# Patient Record
Sex: Female | Born: 1965 | Race: White | Hispanic: No | Marital: Married | Smoking: Former smoker
Health system: Southern US, Community
[De-identification: ages and names within clinical notes are randomized; demographics above are authoritative.]

## PROBLEM LIST (undated history)

## (undated) DIAGNOSIS — E109 Type 1 diabetes mellitus without complications: Secondary | ICD-10-CM

## (undated) DIAGNOSIS — F419 Anxiety disorder, unspecified: Secondary | ICD-10-CM

## (undated) DIAGNOSIS — E7800 Pure hypercholesterolemia, unspecified: Secondary | ICD-10-CM

## (undated) DIAGNOSIS — E079 Disorder of thyroid, unspecified: Secondary | ICD-10-CM

## (undated) DIAGNOSIS — G43909 Migraine, unspecified, not intractable, without status migrainosus: Secondary | ICD-10-CM

## (undated) HISTORY — DX: Migraine, unspecified, not intractable, without status migrainosus: G43.909

## (undated) HISTORY — DX: Disorder of thyroid, unspecified: E07.9

## (undated) HISTORY — DX: Pure hypercholesterolemia, unspecified: E78.00

## (undated) HISTORY — DX: Anxiety disorder, unspecified: F41.9

## (undated) HISTORY — DX: Type 1 diabetes mellitus without complications: E10.9

---

## 1998-02-07 DIAGNOSIS — E109 Type 1 diabetes mellitus without complications: Secondary | ICD-10-CM

## 1998-02-07 HISTORY — DX: Type 1 diabetes mellitus without complications: E10.9

## 1998-10-09 DIAGNOSIS — E119 Type 2 diabetes mellitus without complications: Secondary | ICD-10-CM | POA: Insufficient documentation

## 1998-11-05 ENCOUNTER — Encounter: Admission: RE | Admit: 1998-11-05 | Discharge: 1999-02-03 | Payer: Self-pay | Admitting: *Deleted

## 2000-11-20 ENCOUNTER — Other Ambulatory Visit: Admission: RE | Admit: 2000-11-20 | Discharge: 2000-11-20 | Payer: Self-pay | Admitting: *Deleted

## 2002-05-14 ENCOUNTER — Encounter: Admission: RE | Admit: 2002-05-14 | Discharge: 2002-05-14 | Payer: Self-pay | Admitting: *Deleted

## 2002-06-05 ENCOUNTER — Encounter: Admission: RE | Admit: 2002-06-05 | Discharge: 2002-06-05 | Payer: Self-pay | Admitting: *Deleted

## 2002-06-12 ENCOUNTER — Encounter: Admission: RE | Admit: 2002-06-12 | Discharge: 2002-06-12 | Payer: Self-pay | Admitting: *Deleted

## 2002-06-12 ENCOUNTER — Ambulatory Visit (HOSPITAL_COMMUNITY): Admission: RE | Admit: 2002-06-12 | Discharge: 2002-06-12 | Payer: Self-pay | Admitting: *Deleted

## 2002-06-26 ENCOUNTER — Encounter: Admission: RE | Admit: 2002-06-26 | Discharge: 2002-06-26 | Payer: Self-pay | Admitting: *Deleted

## 2002-07-10 ENCOUNTER — Encounter: Admission: RE | Admit: 2002-07-10 | Discharge: 2002-07-10 | Payer: Self-pay | Admitting: *Deleted

## 2002-07-30 ENCOUNTER — Ambulatory Visit (HOSPITAL_COMMUNITY): Admission: RE | Admit: 2002-07-30 | Discharge: 2002-07-30 | Payer: Self-pay | Admitting: *Deleted

## 2002-08-07 ENCOUNTER — Encounter: Admission: RE | Admit: 2002-08-07 | Discharge: 2002-08-07 | Payer: Self-pay | Admitting: *Deleted

## 2002-08-21 ENCOUNTER — Encounter: Admission: RE | Admit: 2002-08-21 | Discharge: 2002-08-21 | Payer: Self-pay | Admitting: *Deleted

## 2002-09-04 ENCOUNTER — Encounter: Admission: RE | Admit: 2002-09-04 | Discharge: 2002-09-04 | Payer: Self-pay | Admitting: *Deleted

## 2002-09-25 ENCOUNTER — Encounter: Admission: RE | Admit: 2002-09-25 | Discharge: 2002-09-25 | Payer: Self-pay | Admitting: *Deleted

## 2002-09-26 ENCOUNTER — Inpatient Hospital Stay (HOSPITAL_COMMUNITY): Admission: AD | Admit: 2002-09-26 | Discharge: 2002-09-26 | Payer: Self-pay | Admitting: Obstetrics & Gynecology

## 2002-10-16 ENCOUNTER — Encounter: Admission: RE | Admit: 2002-10-16 | Discharge: 2002-10-16 | Payer: Self-pay | Admitting: *Deleted

## 2002-10-29 ENCOUNTER — Inpatient Hospital Stay (HOSPITAL_COMMUNITY): Admission: AD | Admit: 2002-10-29 | Discharge: 2002-10-30 | Payer: Self-pay | Admitting: *Deleted

## 2002-10-30 ENCOUNTER — Encounter: Admission: RE | Admit: 2002-10-30 | Discharge: 2002-10-30 | Payer: Self-pay | Admitting: *Deleted

## 2002-10-31 ENCOUNTER — Encounter: Payer: Self-pay | Admitting: Obstetrics and Gynecology

## 2002-10-31 ENCOUNTER — Inpatient Hospital Stay (HOSPITAL_COMMUNITY): Admission: RE | Admit: 2002-10-31 | Discharge: 2002-11-01 | Payer: Self-pay | Admitting: *Deleted

## 2002-11-04 ENCOUNTER — Ambulatory Visit (HOSPITAL_COMMUNITY): Admission: RE | Admit: 2002-11-04 | Discharge: 2002-11-04 | Payer: Self-pay | Admitting: *Deleted

## 2002-11-06 ENCOUNTER — Encounter: Admission: RE | Admit: 2002-11-06 | Discharge: 2002-11-06 | Payer: Self-pay | Admitting: *Deleted

## 2002-11-11 ENCOUNTER — Encounter: Admission: RE | Admit: 2002-11-11 | Discharge: 2002-11-11 | Payer: Self-pay | Admitting: *Deleted

## 2002-11-13 ENCOUNTER — Encounter: Admission: RE | Admit: 2002-11-13 | Discharge: 2002-11-13 | Payer: Self-pay | Admitting: *Deleted

## 2002-11-18 ENCOUNTER — Encounter: Admission: RE | Admit: 2002-11-18 | Discharge: 2002-11-18 | Payer: Self-pay | Admitting: *Deleted

## 2002-11-20 ENCOUNTER — Encounter: Admission: RE | Admit: 2002-11-20 | Discharge: 2002-11-20 | Payer: Self-pay | Admitting: *Deleted

## 2002-11-25 ENCOUNTER — Encounter: Admission: RE | Admit: 2002-11-25 | Discharge: 2002-11-25 | Payer: Self-pay | Admitting: *Deleted

## 2002-11-27 ENCOUNTER — Encounter: Admission: RE | Admit: 2002-11-27 | Discharge: 2002-11-27 | Payer: Self-pay | Admitting: *Deleted

## 2002-11-29 ENCOUNTER — Inpatient Hospital Stay (HOSPITAL_COMMUNITY): Admission: AD | Admit: 2002-11-29 | Discharge: 2002-12-02 | Payer: Self-pay | Admitting: Family Medicine

## 2004-06-17 ENCOUNTER — Emergency Department (HOSPITAL_COMMUNITY): Admission: EM | Admit: 2004-06-17 | Discharge: 2004-06-18 | Payer: Self-pay | Admitting: Emergency Medicine

## 2004-06-18 ENCOUNTER — Ambulatory Visit (HOSPITAL_COMMUNITY): Admission: RE | Admit: 2004-06-18 | Discharge: 2004-06-18 | Payer: Self-pay | Admitting: Internal Medicine

## 2004-11-16 ENCOUNTER — Emergency Department (HOSPITAL_COMMUNITY): Admission: EM | Admit: 2004-11-16 | Discharge: 2004-11-16 | Payer: Self-pay | Admitting: *Deleted

## 2004-12-09 ENCOUNTER — Other Ambulatory Visit: Admission: RE | Admit: 2004-12-09 | Discharge: 2004-12-09 | Payer: Self-pay | Admitting: Internal Medicine

## 2005-10-11 ENCOUNTER — Ambulatory Visit: Payer: Self-pay

## 2005-10-18 ENCOUNTER — Ambulatory Visit: Payer: Self-pay

## 2005-11-15 ENCOUNTER — Ambulatory Visit: Payer: Self-pay

## 2005-11-22 ENCOUNTER — Ambulatory Visit: Payer: Self-pay

## 2005-11-29 ENCOUNTER — Ambulatory Visit: Payer: Self-pay

## 2005-12-06 ENCOUNTER — Ambulatory Visit: Payer: Self-pay

## 2005-12-13 ENCOUNTER — Ambulatory Visit: Payer: Self-pay

## 2005-12-20 ENCOUNTER — Ambulatory Visit: Payer: Self-pay

## 2006-01-10 ENCOUNTER — Ambulatory Visit: Payer: Self-pay

## 2006-03-21 ENCOUNTER — Ambulatory Visit: Payer: Self-pay

## 2006-04-04 ENCOUNTER — Ambulatory Visit: Payer: Self-pay

## 2007-02-12 ENCOUNTER — Ambulatory Visit: Payer: Self-pay | Admitting: Family Medicine

## 2007-02-12 ENCOUNTER — Encounter: Payer: Self-pay | Admitting: Family Medicine

## 2007-02-12 ENCOUNTER — Other Ambulatory Visit: Admission: RE | Admit: 2007-02-12 | Discharge: 2007-02-12 | Payer: Self-pay | Admitting: Family Medicine

## 2007-03-06 ENCOUNTER — Ambulatory Visit: Payer: Self-pay | Admitting: Family Medicine

## 2010-04-05 ENCOUNTER — Encounter (INDEPENDENT_AMBULATORY_CARE_PROVIDER_SITE_OTHER): Payer: BC Managed Care – PPO | Admitting: Family Medicine

## 2010-04-05 ENCOUNTER — Other Ambulatory Visit (HOSPITAL_COMMUNITY)
Admission: RE | Admit: 2010-04-05 | Discharge: 2010-04-05 | Disposition: A | Discharge status: Home / Self Care | Payer: BC Managed Care – PPO | Source: Ambulatory Visit | Attending: Family Medicine | Admitting: Family Medicine

## 2010-04-05 ENCOUNTER — Other Ambulatory Visit: Payer: Self-pay | Admitting: Family Medicine

## 2010-04-05 DIAGNOSIS — Z0000 Encounter for general adult medical examination without abnormal findings: Secondary | ICD-10-CM

## 2010-04-05 DIAGNOSIS — Z124 Encounter for screening for malignant neoplasm of cervix: Secondary | ICD-10-CM | POA: Insufficient documentation

## 2010-04-05 DIAGNOSIS — E109 Type 1 diabetes mellitus without complications: Secondary | ICD-10-CM

## 2010-06-25 NOTE — Consult Note (Signed)
NAME:  Briana Lopez, PELTZ NO.:  [id number]   MEDICAL RECORD NO.:  [id number]          PATIENT TYPE:  EMS   LOCATION:  ED                           FACILITY:  [facility]   PHYSICIAN:  Candyce Churn, M.D.DATE OF BIRTH:  [birth year]   DATE OF CONSULTATION:  06/17/2004  DATE OF DISCHARGE:                                   CONSULTATION   CHIEF COMPLAINT:  Right upper quadrant pain for greater than 24 hours.   HISTORY OF PRESENT ILLNESS:  Briana Lopez is a very pleasant 45 year old  female with a history of insulin-dependent diabetes mellitus for five years.  That is her only medical problem.   She states that over the past two months she has had a couple of episodes of  right upper quadrant pain that has not necessarily been related to eating.  Apparently one of these was at night.  Over the past 24 to 36 hours, she has  had fairly persistent right upper quadrant soreness and feels that if she  could  just throw up, that she would feel better.  She denies any fever or  chills, diarrhea, vomiting, shortness of breath, PND or orthopnea.  She has  no pleuritic symptoms.   She presents to the [facility] Emergency Room for evaluation.   ALLERGIES:  Question of an allergy to PENICILLIN but very questionable.  Was  told that she might be allergic to penicillin as a child.   MEDICATIONS:  The patient is on an insulin pump with Humalog.  Her regimen  for insulin is as follows:  From 12 midnight to 3 a.m., she is on 0.3 units  per hour.  From 3 a.m. to 6 a.m., she is on 0.5 units per hour.  From 6 a.m.  to 5 p.m., she is on 0.6 units per hour and from 5 p.m. to 12 midnight, she  is on 0.55 units per hour.  She gives herself bolus therapy of one unit for  every 10 g of food.  Her endocrinologist is Dr. Elmer Picker at [facility].   Other medications include digestive enzymes originally prescribed by Dr.  Eldred Manges called Digestozyme.  Apparently these  are proteases, lipases  and amylases in capsule form.   PAST SURGICAL HISTORY:  Noncontributory.   OB/GYN HISTORY:  She is gravida 3, para 3.   FAMILY HISTORY:  Significant for breast cancer in her father's mother and  her mother's grandmother and her mother's great grandmother had colon  cancer.  Her father is a Counsellor and smokes and drinks too much according to  her.  Her mother died at age 44 secondary to a motorcycle accident.   SOCIAL HISTORY:  She does not smoke and she does drink one glass of wine at  night.  Her husband is a Investment banker, operational at [employer] and also [address] here in  [place].  Her children are Selena age 25, Zane age 8, and Lorre Munroe age 19-1/2.   REVIEW OF SYSTEMS:  As per HPI.   PHYSICAL EXAMINATION:  GENERAL:  She is alert,  oriented female, complaining  of right upper quadrant pain.  She just wants it to go away.  VITAL SIGNS:  Temperature 98, blood pressure 128/89, heart rate 74 and  regular, respiratory rate 20 and nonlabored.  HEENT:  Atraumatic, normocephalic.  Pupils equal and reactive.  Oropharynx  clear and moist.  NECK:  Supple without JVD, lymphadenopathy or thyromegaly.  CHEST:  Clear to auscultation.  CARDIAC:  Regular rhythm without murmur, rub, or gallop.  ABDOMEN:  Soft and tender in the right upper quadrant especially with  inspiration up under the costal margin.  Not severely so but certainly  uncomfortable for her.  There is some mild tenderness over the right lower  quadrant but not nearly so much as in the right upper quadrant.  Her bowel  sounds are normal and there is no rebound.  PELVIC AND RECTAL:  Not performed.  EXTREMITIES:  Without clubbing, cyanosis or edema.  Good capillary refill  distally.  NEUROLOGIC:  Oriented x3, completely intact peripherally.   LABORATORY DATA:  Chest x-ray reveals no active disease.  EKG reveals sinus  rhythm at 60 beats per minute.  Normal EKG.  Normal intervals and axis.  No  ST segment depression or  elevation.  White blood cell count 5200, hemoglobin  12.4, platelet count 330,000.  She has 56% neutrophils on differential, 34%  lymphocytes.  Total protein is 6.4, albumin 3.4, sodium 136, potassium 3.6,  chloride 102, bicarb 28, glucose 133, BUN 16, creatinine 0.7, calcium 9.  Total protein is 6.4, albumin 3.4, AST 18, ALT 15, alk phos 50, total  bilirubin 0.7, direct bilirubin 0.1, indirect bilirubin 0.6. Amylase is  normal at 38, lipase normal at 23.  Abdominal ultrasound is pending.   ASSESSMENT:  A 45 year old female with insulin-dependent diabetes mellitus  with normal vital signs, laboratories, and the only abnormality on physical  examination currently is tenderness in the right upper quadrant.  I suspect  that she could have biliary colic with a stone in the cystic duct.  She has  no evidence for any infection at this time.  Other causes of mild right  lower quadrant and increase in her right upper quadrant pain could be  rupture ovarian cyst.  There is low probability for appendicitis.   Should her abdominal ultrasound be normal, could certainly discharge her  home on pain medication and schedule a biliary scan tomorrow to further rule  out cholecystitis.       RNG/MEDQ  D:  06/17/2004  T:  06/18/2004  Job:  [id number]

## 2010-06-25 NOTE — Discharge Summary (Signed)
NAME:  Briana Lopez, Briana Lopez                          ACCOUNT NO.:  [id number]   MEDICAL RECORD NO.:  [id number]                   PATIENT TYPE:  INP   LOCATION:  9105                                 FACILITY:  [facility]   PHYSICIAN:  Tanya S. Shawnie Pons, M.D.                DATE OF BIRTH:  [birth year]   DATE OF ADMISSION:  10/30/2002  DATE OF DISCHARGE:                                 DISCHARGE SUMMARY   FINAL DIAGNOSES:  1. Intrauterine pregnancy at 33 weeks.  2. Insulin-dependent diabetes.  3. Persistent headache, unclear etiology.   PROCEDURES:  Include a CT of the brain as well as lumbar puncture.   CONSULTANTS:  Neurology.   PERTINENT LABORATORY DATA:  Includes a 24-hour urine that revealed 124 mg of  protein.  Her uric acid was 1.8, her LDH was less than 105, a CMP that was  within normal limits, a CBC that was also normal.  She also had a CSF  culture that was negative as well as a gram stain which was also negative.  She had normal intracranial pressure.  She had daily NSTs that looked to be  normal.   REASON FOR ADMISSION:  The patient was a 45 year old gravida 6 para 2-0-3-2  at 33 weeks who came into the high risk clinic complaining of persistent  headache.  Following evaluation the patient was brought in for full  evaluation and neurology consult.  On the neurology consult MRI and MR  venogram were ordered.  However, the patient is very claustrophobic and  could not tolerate these so instead a CT without contrast was used to rule  out subdural hematoma, subarachnoid hemorrhage, evidence of intracranial  pressure tumor, and all these things were ruled out by this exam.  She also  underwent an LP.  There was no evidence of increased intracranial pressure  and gram stain and cytology were negative for CSF.  Given the negative  workup it was felt the patient was stable for discharge.  However, she did  continue to have headache at that time.   DISCHARGE DISPOSITION:  1. The patient  discharged home in slightly improved condition.  2. Instructions will include activity as tolerated, return with worsening     headache or focal neurologic symptoms.  3. Discharge medications include insulin pump to be used as she has been     using in the hospital and Percocet 5/325 one to two p.o. q.4-6h. p.r.n.     pain.  4. Follow up in high risk clinic on Wednesday if pain medicine fails to be     adequate or is not meeting her needs.  Will reassess her pain needs at     that time.  Shelbie Proctor. Shawnie Pons, M.D.    TSP/MEDQ  D:  11/01/2002  T:  11/01/2002  Job:  [id number]

## 2010-06-25 NOTE — Consult Note (Signed)
NAME:  Briana Lopez, BERGSMA NO.:  [id number]   MEDICAL RECORD NO.:  [id number]                   PATIENT TYPE:  OUT   LOCATION:  ULT                                  FACILITY:  [facility]   PHYSICIAN:  Melvyn Novas, M.D.               DATE OF BIRTH:  04/10/1965   DATE OF CONSULTATION:  DATE OF DISCHARGE:                                   CONSULTATION   REASON FOR ADMISSION:  Briana Lopez is admitted today  for new onset headache.  This is her neurological chief complaint.   HISTORY AND PHYSICAL:  The patient is a 45 year old right-handed Caucasian  female, gravida 3, para 2, insulin dependent diabetic since July 2001, who  presents at 44 weeks of pregnancy with severe  onset of headache last night  at about 9 p.m. The headaches, according to the patient woke her from sleep,  and she also noticed that she had some visual changes associated with it.  She described white dots and lights in the peripheral vision, a more  migrainous phenomenon. The patient denied, however, vertigo, nausea or  vomiting and had no focal weakness or neurologic symptoms otherwise. She was  able to resume sleeping earlier today  after presenting to the emergency  room at 2 a.m.   She denies a history of migraine but has  a positive family history in her  maternal family. She also has no recent febrile illness, but  stated that  she had to cough a couple of times earlier today, nonproductive, but felt  the headache increase with each coughing.   REVIEW OF SYSTEMS:  Negative for nausea or vomiting, fever, abdominal  cramping, neck  stiffness or changes in the level of consciousness. She  describes a pounding, focal, left parietal headache that is getting worse  with Valsalva maneuver. She has no history of recent  infections or  migraine.   FAMILY HISTORY:  Positive for migraine.   SOCIAL HISTORY:  The patient is married. She has 2 children, 5 and 2 years  old. No alcohol, drug  or  nicotine use.   PAST MEDICAL HISTORY:  1. Positive for gestational diabetes with the 2 earlier pregnancies.  2. Since July 2000, the patient developed diabetes outside her previous     pregnancies and now has become insulin dependent.  3. She has no history hypertension, renal problems or neuropathy.   MEDICATIONS:  Insulin and prenatal vitamins.   ALLERGIES:  None.   PHYSICAL EXAMINATION:  GENERAL: The patient is healthy appearing, well  groomed, slender, fully oriented, alert and pleasant. She has no clubbing,  cyanosis or edema. No mucous membrane abnormalities, no icterus or rash.  There is also no lymphadenopathy.  VITAL SIGNS:  Blood pressure 196/86, heart rate 72, respiratory rate 22 to  24, temperature 98 degrees  Farenheit.  NEUROLOGIC:  Pupils are equal, round and reactive to light and  accomodation.  She has full and conuit extraocular movements without nystagmus. No  papilledema. No visual fields deficits. No focal asymmetry or sensory loss  over  the face. Uvula was midline  with prompt gag. The neck  is supple. She  has full range of motion. Motor examination  shows 5/5 strength, equal tone  and mass. Sensory is intact to vibration and touch. Coordination, the  patient shows  a normal finger-to-nose test without signs of ataxia, tremor  or dysmetria. Gait and stance are intact. The patient walked to the bathroom  and back to the bed  without any coordination deficits.   ASSESSMENT:  Sudden throbbing headache without signs of infection and  without any history of migraine, in a diabetic 45 year old woman, [redacted] weeks  pregnant. Her BUN is 10 and in discussion with Dr. Gavin Potters I learned that  this is considered a mild dehydration syndrome in a pregnant woman. She has  leg spasms but no abdominal cramping and has no history of vascular  headaches. She could have developed venous rheological abnormalities.   PLAN:  Treat her first with oral magnesium q.4h. and give at the  same time  Tylenol #3 for the headaches p.o. I also encouraged oral fluid intake. If  the patient cannot drink at least 2 liters of fluid a day, I would  supplement with IV. If this fails to treat the headache over the next 3 to 4  hour periods, I suggest to obtain an MR venogram. Should the headaches  become worse in this time period, I would do the MR venogram even urgently.  I thank Grandis for the consultation and hope that Briana Lopez can go home  tomorrow morning in good health.                                                Melvyn Novas, M.D.    CD/MEDQ  D:  10/30/2002  T:  11/03/2002  Job:  [id number]

## 2010-06-25 NOTE — Consult Note (Signed)
   NAME:  Briana Lopez, Briana Lopez                          ACCOUNT NO.:  [id number]   MEDICAL RECORD NO.:  [id number]                   PATIENT TYPE:  INP   LOCATION:  9105                                 FACILITY:  [facility]   PHYSICIAN:  Melvyn Novas, M.D.               DATE OF BIRTH:  1965/08/18   DATE OF CONSULTATION:  DATE OF DISCHARGE:  11/01/2002                                   CONSULTATION   PROCEDURE:  Lumbar puncture.   INDICATIONS FOR PROCEDURE:  Lumbar puncture. The patient's blood laboratory  results reveal on coagulopathy or cytopenia. The patient was admitted with  vascular appearing headaches and had a normal CT scan of the brain as well  as lack of papilledema and no focal neurologic deficits. The LP is intended  to send CSF to rule out a subarachnoid hemorrhage or viral infection.   DESCRIPTION OF PROCEDURE:  The patient was placed sitting  on the edge of  the  bed, bending forward towards her nurse who assisted the patient. Easy  landmarks were visible and demarcated between L2 and L3. A 1% Lidocaine  solution was used to numb the area. The patient was prepped with Betadine  and draped with sterile cloth.   Then 4 vials of 2 mL of CSF were obtained showing untinged clear fluid  without cloudiness, blood or icteric color. The needle was retrieved and no  bleed focally occurred. The patient received a Band-Aid over the area of the  lumbar puncture and was asked to rest for 90 minutes and oral fluids were  encouraged. We will send for cells, Gram stain, viral panel and  cytology as  well as serial red blood cell count.                                               Melvyn Novas, M.D.    CD/MEDQ  D:  10/31/2002  T:  11/03/2002  Job:  [id number]

## 2011-08-01 ENCOUNTER — Encounter: Payer: Self-pay | Admitting: Internal Medicine

## 2011-08-04 ENCOUNTER — Encounter: Payer: Self-pay | Admitting: Family Medicine

## 2011-08-04 ENCOUNTER — Ambulatory Visit (INDEPENDENT_AMBULATORY_CARE_PROVIDER_SITE_OTHER): Payer: BC Managed Care – PPO | Admitting: Family Medicine

## 2011-08-04 VITALS — BP 120/88 | HR 64 | Ht 64.5 in | Wt 127.0 lb

## 2011-08-04 DIAGNOSIS — E7800 Pure hypercholesterolemia, unspecified: Secondary | ICD-10-CM

## 2011-08-04 DIAGNOSIS — E109 Type 1 diabetes mellitus without complications: Secondary | ICD-10-CM

## 2011-08-04 DIAGNOSIS — Z0000 Encounter for general adult medical examination without abnormal findings: Secondary | ICD-10-CM

## 2011-08-04 DIAGNOSIS — R5381 Other malaise: Secondary | ICD-10-CM

## 2011-08-04 DIAGNOSIS — R5383 Other fatigue: Secondary | ICD-10-CM

## 2011-08-04 LAB — POCT URINALYSIS DIPSTICK
Ketones, UA: NEGATIVE
Protein, UA: NEGATIVE
Spec Grav, UA: 1.01

## 2011-08-04 NOTE — Patient Instructions (Addendum)
HEALTH MAINTENANCE RECOMMENDATIONS:  It is recommended that you get at least 30 minutes of aerobic exercise at least 5 days/week (for weight loss, you may need as much as 60-90 minutes). This can be any activity that gets your heart rate up. This can be divided in 10-15 minute intervals if needed, but try and build up your endurance at least once a week.  Weight bearing exercise is also recommended twice weekly.  Eat a healthy diet with lots of vegetables, fruits and fiber.  "Colorful" foods have a lot of vitamins (ie green vegetables, tomatoes, red peppers, etc).  Limit sweet tea, regular sodas and alcoholic beverages, all of which has a lot of calories and sugar.  Up to 1 alcoholic drink daily may be beneficial for women (unless trying to lose weight, watch sugars).  Drink a lot of water.  Calcium recommendations are 1200-1500 mg daily (1500 mg for postmenopausal women or women without ovaries), and vitamin D 1000 IU daily.  This should be obtained from diet and/or supplements (vitamins), and calcium should not be taken all at once, but in divided doses.  Monthly self breast exams and yearly mammograms for women over the age of 69 is recommended.  Sunscreen of at least SPF 30 should be used on all sun-exposed parts of the skin when outside between the hours of 10 am and 4 pm (not just when at beach or pool, but even with exercise, golf, tennis, and yard work!)  Use a sunscreen that says "broad spectrum" so it covers both UVA and UVB rays, and make sure to reapply every 1-2 hours.  Remember to change the batteries in your smoke detectors when changing your clock times in the spring and fall.  Use your seat belt every time you are in a car, and please drive safely and not be distracted with cell phones and texting while driving.  I will review labs from Dr. Almond Lint.  You should discuss your cholesterol with him--whether or not a medication (even if OTC like red yeast rice) is recommended.  Please DO  NOT SMOKE. Get flu shots yearly Please schedule your mammogram

## 2011-08-04 NOTE — Progress Notes (Signed)
Chief Complaint  Patient presents with  . Annual Exam    annual exam with pap. Just had labs done in May with her endocrinologist in [place]. Did make mention that she is having trouble having orgasms lately, unsure if this is due to diabetes or just her age.   No problems achieving orgasm with self-stimulation, just with boyfriend.  Libido is okay. She reports that her BP high this morning due to a very stressful morning.   She had labs done through [facility] recently, and will forward results.  Hyperlipidemia:  Last LDL 139.  She states she showed to Dr. Almond Lint (her endo) and was told it was "fine".  She knows that she eats too much red meat, and loves her cheese and wine, but has made some adjustments to her diet since last year.  Labs recently drawn through [facility] Maintenance: Immunization History  Administered Date(s) Administered  . Tdap 04/05/2010  occasionally gets flu shots, not last year. Got pneumovax a few years ago (through Dr. Almond Lint) Last Pap smear: 03/2010 Last mammogram: 3/09 Last colonoscopy: never Last DEXA: never Dentist: twice yearly Ophtho: within the year (Jan or Feb) Exercise: daily (Crossfit, running and hot yoga)  Past Medical History  Diagnosis Date  . Diabetes mellitus type 1 2000  . Pure hypercholesterolemia   . Migraines     with pregnancy    History reviewed. No pertinent past surgical history.  History   Social History  . Marital Status: Married    Spouse Name: N/A    Number of Children: 3  . Years of Education: N/A   Occupational History  . hair stylist    Social History Main Topics  . Smoking status: Current Some Day Smoker    Types: Cigarettes  . Smokeless tobacco: Never Used  . Alcohol Use: Yes     6-8 drinks per week (on Thurs thru Sat)  . Drug Use: No  . Sexually Active: Yes -- Female partner(s)     boyfriend had vasectomy   Other Topics Concern  . Not on file   Social History Narrative   Divorced.  Lives with 3 children, 2  dogs and a cat    Family History  Problem Relation Age of Onset  . Cancer Father     lung cancer  . Hypertension Father   . Alcohol abuse Brother   . Cancer Maternal Grandmother     colon cancer in 29's  . Cancer Paternal Grandmother     breast cancer in her 6's  . Heart disease Paternal Grandmother   . Diabetes Neg Hx     Current outpatient prescriptions:insulin detemir (LEVEMIR) 100 UNIT/ML injection, Inject 7 Units into the skin 2 (two) times daily. , Disp: , Rfl: ;  insulin lispro (HUMALOG) 100 UNIT/ML injection, Inject into the skin 3 (three) times daily before meals. 1 U SQ for every 10 carbs she eats, Disp: , Rfl: ;  Multiple Vitamins-Minerals (MULTIVITAMIN WITH MINERALS) tablet, Take 1 tablet by mouth daily., Disp: , Rfl:   Allergies  Allergen Reactions  . Penicillins    ROS: The patient denies anorexia, fever, weight changes, headaches,  vision changes, decreased hearing, ear pain, sore throat, breast concerns, chest pain, palpitations, dizziness, syncope, dyspnea on exertion, cough, swelling, nausea, vomiting, diarrhea, constipation, abdominal pain, melena, hematochezia, indigestion/heartburn, hematuria, incontinence, dysuria, irregular menstrual cycles, vaginal discharge, odor or itch, genital lesions, joint pains, numbness, tingling, weakness, tremor, suspicious skin lesions, depression, anxiety, abnormal bleeding/bruising, or enlarged lymph nodes.  PHYSICAL EXAM: BP 120/88  Pulse 64  Ht 5' 4.5" (1.638 m)  Wt 127 lb (57.607 kg)  BMI 21.46 kg/m2  LMP 07/12/2011  General Appearance:    Alert, cooperative, no distress, appears stated age  Head:    Normocephalic, without obvious abnormality, atraumatic  Eyes:    PERRL, conjunctiva/corneas clear, EOM's intact, fundi    benign  Ears:    Normal TM's and external ear canals  Nose:   Nares normal, mucosa normal, no drainage or sinus   tenderness  Throat:   Lips, mucosa, and tongue normal; teeth and gums normal  Neck:    Supple, no lymphadenopathy;  thyroid:  no   enlargement/tenderness/nodules; no carotid   bruit or JVD  Back:    Spine nontender, no curvature, ROM normal, no CVA     tenderness  Lungs:     Clear to auscultation bilaterally without wheezes, rales or     ronchi; respirations unlabored  Chest Wall:    No tenderness or deformity   Heart:    Regular rate and rhythm, S1 and S2 normal, no murmur, rub   or gallop  Breast Exam:    No tenderness, masses, or nipple discharge or inversion.      No axillary lymphadenopathy  Abdomen:     Soft, non-tender, nondistended, normoactive bowel sounds,    no masses, no hepatosplenomegaly  Genitalia:    Normal external genitalia without lesions.  BUS and vagina normal; cervix without lesions, or cervical motion tenderness. No abnormal vaginal discharge.  Uterus and adnexa not enlarged, nontender, no masses.  Pap not performed  Rectal:    Normal tone, no masses or tenderness; guaiac negative stool  Extremities:   No clubbing, cyanosis or edema  Pulses:   2+ and symmetric all extremities  Skin:   Skin color, texture, turgor normal, no rashes or lesions  Lymph nodes:   Cervical, supraclavicular, and axillary nodes normal  Neurologic:   CNII-XII intact, normal strength, sensation and gait; reflexes 2+ and symmetric throughout          Psych:   Normal mood, affect, hygiene and grooming.    ASSESSMENT/PLAN: 1. Routine general medical examination at a health care facility  POCT Urinalysis Dipstick, Visual acuity screening, HIV Antibody, GC/Chlamydia Amp Probe, Genital  2. Other malaise and fatigue  Vitamin D 25 hydroxy  3. Type I (juvenile type) diabetes mellitus without mention of complication, not stated as uncontrolled    4. Pure hypercholesterolemia     Hyperlipidemia.  Reviewed low cholesterol diet.  Discussed goals of LDL, as well as red yeast rice/OTC ways to treat (vs rx statins, and discussed need for monitoring LFT's while using).  Recommend discussing with  endo (and who would treat/monitor).  Await labs.  Flu shots recommended yearly.  UTD on pneumovax per pt. Schedule mammogram, past due, encouraged to get them yearly.  Review labs from [facility] pt if any additional tests needed (?if thyroid done--likely c-met, lipids, A1c, and other tests were done) Discussed monthly self breast exams and yearly mammograms after the age of 65; at least 30 minutes of aerobic activity at least 5 days/week; proper sunscreen use reviewed; healthy diet, including goals of calcium and vitamin D intake and alcohol recommendations (less than or equal to 1 drink/day) reviewed; regular seatbelt use; changing batteries in smoke detectors.  Immunization recommendations discussed--yearly flu shots recommended.  Colonoscopy recommendations reviewed--age 4  Trouble with orgasms--discussed that doesn't appear to have physiologic cause (ie diabetes) given  that she is able to achieve orgasm with self-stimulation.  Discussed that it could be related to issues with her partner/stressors, and that she can help show him how to help her.

## 2011-08-05 LAB — VITAMIN D 25 HYDROXY (VIT D DEFICIENCY, FRACTURES): Vit D, 25-Hydroxy: 43 ng/mL (ref 30–89)

## 2011-08-05 LAB — GC/CHLAMYDIA PROBE AMP, GENITAL: GC Probe Amp, Genital: NEGATIVE

## 2012-06-05 DIAGNOSIS — R7989 Other specified abnormal findings of blood chemistry: Secondary | ICD-10-CM | POA: Insufficient documentation

## 2012-07-26 ENCOUNTER — Ambulatory Visit: Payer: BC Managed Care – PPO | Admitting: Family Medicine

## 2012-08-08 ENCOUNTER — Encounter: Payer: Self-pay | Admitting: Family Medicine

## 2012-08-08 ENCOUNTER — Ambulatory Visit: Payer: BC Managed Care – PPO | Admitting: Family Medicine

## 2012-08-08 ENCOUNTER — Ambulatory Visit (INDEPENDENT_AMBULATORY_CARE_PROVIDER_SITE_OTHER): Payer: BC Managed Care – PPO | Admitting: Family Medicine

## 2012-08-08 VITALS — BP 112/80 | HR 72 | Ht 65.0 in | Wt 130.0 lb

## 2012-08-08 DIAGNOSIS — R102 Pelvic and perineal pain: Secondary | ICD-10-CM

## 2012-08-08 DIAGNOSIS — N949 Unspecified condition associated with female genital organs and menstrual cycle: Secondary | ICD-10-CM

## 2012-08-08 NOTE — Patient Instructions (Addendum)
We will be scheduling you for a pelvic ultrasound.  You likely have a small ovarian cyst.  Continue to use ibuprofen as needed for the pain. We will contact you with results and plan.

## 2012-08-08 NOTE — Progress Notes (Signed)
Chief Complaint  Patient presents with  . Advice Only    pain in ovary area, more on the right side x 6 months. Notices that when her period is about to start it worsens, also if she has to have a bowel movement. Does state that she had PID in her early twenties and it feels the same.   Pain had been intermittent, but became more constant in the last 3-4 weeks.  Pain is RLQ, sometimes she also has discomfort on the left (the week before menses).  Slight vaginal discharge, white, unchanged from her norm.  There is no odor, itching. She is in a monogamous relationship x 4 years, unprotected.  She started doing Crossfit in February.  Denies pain with exercise, no noted bulging.  Menses are regular, once a month, but changing--last month was heavy, and lasted 8-9 days, this most recent period was shorter and lighter.  No h/o ovarian cysts.  H/o PID in her 21's  Past Medical History  Diagnosis Date  . Diabetes mellitus type 1 2000  . Pure hypercholesterolemia   . Migraines     with pregnancy   No past surgical history on file.  History   Social History  . Marital Status: Married    Spouse Name: N/A    Number of Children: 3  . Years of Education: N/A   Occupational History  . hair stylist    Social History Main Topics  . Smoking status: Current Some Day Smoker    Types: Cigarettes  . Smokeless tobacco: Never Used  . Alcohol Use: Yes     Comment: 6-8 drinks per week (on Thurs thru Sat)  . Drug Use: No  . Sexually Active: Yes -- Female partner(s)     Comment: boyfriend had vasectomy   Other Topics Concern  . Not on file   Social History Narrative   Divorced.  Lives with 3 children, 2 dogs and a cat   Current outpatient prescriptions:ALPRAZolam (XANAX) 0.5 MG tablet, Take 0.25 mg by mouth at bedtime as needed (for flying)., Disp: , Rfl: ;  insulin detemir (LEVEMIR) 100 UNIT/ML injection, Inject 9 Units into the skin 2 (two) times daily. , Disp: , Rfl: ;  Insulin Glulisine (APIDRA  SOLOSTAR IJ), Inject 1 Units as directed 5 (five) times daily as needed (uses 1 Unit per 15 carbs.)., Disp: , Rfl:  Nutritional Supplements (NUTRITIONAL SUPPLEMENT PO), Take 2 capsules by mouth 2 (two) times daily. Thyroid supplement ok'd by endocrinologist., Disp: , Rfl: ;  Multiple Vitamins-Minerals (MULTIVITAMIN WITH MINERALS) tablet, Take 1 tablet by mouth daily., Disp: , Rfl:   Allergies  Allergen Reactions  . Penicillins    ROS:  Denies fevers, chills, cough, shortness of breath, chest pain, nausea, vomiting, bowel changes, dysuria, bleeding/bruising, or other concerns.  See HPI  PHYSICAL EXAM: BP 112/80  Pulse 72  Ht 5\' 5"  (1.651 m)  Wt 130 lb (58.968 kg)  BMI 21.63 kg/m2  LMP 08/02/2012 Well developed, pleasant female in no distress Heart: regular rate and rhythm Lungs: clear Abdomen: soft, normal bowel sounds.  Area of discomfort is right lower abdomen, in suprapubic area.  nontender at McBurney's point.   No mass, rebound tenderness or guarding. Pelvic exam--tender at R adnexa, not enlarged.  No cervical motion tenderness.  L adnexa normal, nontender  ASSESSMENT/PLAN:  Pelvic pain in female - Plan: US Pelvis Complete, US Transvaginal Non-OB  Pelvic u/s to eval for suspected small right ovarian cyst.  Continue ibuprofen as needed  Refer to GYN only if concern on u/s, of increasing pain

## 2012-08-20 ENCOUNTER — Ambulatory Visit
Admission: RE | Admit: 2012-08-20 | Discharge: 2012-08-20 | Disposition: A | Discharge status: Home / Self Care | Payer: BC Managed Care – PPO | Source: Ambulatory Visit | Attending: Family Medicine | Admitting: Family Medicine

## 2012-08-20 ENCOUNTER — Other Ambulatory Visit: Payer: Self-pay | Admitting: Family Medicine

## 2012-08-20 ENCOUNTER — Other Ambulatory Visit: Payer: BC Managed Care – PPO

## 2012-08-20 DIAGNOSIS — R102 Pelvic and perineal pain: Secondary | ICD-10-CM

## 2012-08-20 DIAGNOSIS — Z1231 Encounter for screening mammogram for malignant neoplasm of breast: Secondary | ICD-10-CM

## 2012-10-01 ENCOUNTER — Other Ambulatory Visit: Payer: Self-pay | Admitting: Family Medicine

## 2012-10-01 ENCOUNTER — Ambulatory Visit (HOSPITAL_COMMUNITY)
Admission: RE | Admit: 2012-10-01 | Discharge: 2012-10-01 | Disposition: A | Discharge status: Home / Self Care | Payer: BC Managed Care – PPO | Source: Ambulatory Visit | Attending: Family Medicine | Admitting: Family Medicine

## 2012-10-01 DIAGNOSIS — Z1231 Encounter for screening mammogram for malignant neoplasm of breast: Secondary | ICD-10-CM

## 2012-10-05 ENCOUNTER — Telehealth: Payer: Self-pay | Admitting: Internal Medicine

## 2012-10-05 NOTE — Telephone Encounter (Signed)
Faxed over medical records to [facility] system @ [phone]

## 2013-05-29 ENCOUNTER — Encounter: Payer: Self-pay | Admitting: Family Medicine

## 2014-02-21 ENCOUNTER — Encounter: Payer: Self-pay | Admitting: Internal Medicine

## 2014-05-09 ENCOUNTER — Encounter: Payer: Self-pay | Admitting: Internal Medicine

## 2014-05-16 ENCOUNTER — Ambulatory Visit (INDEPENDENT_AMBULATORY_CARE_PROVIDER_SITE_OTHER): Payer: BLUE CROSS/BLUE SHIELD | Admitting: Internal Medicine

## 2014-05-16 DIAGNOSIS — Z7189 Other specified counseling: Secondary | ICD-10-CM

## 2014-05-16 DIAGNOSIS — Z23 Encounter for immunization: Secondary | ICD-10-CM

## 2014-05-16 DIAGNOSIS — Z7184 Encounter for health counseling related to travel: Secondary | ICD-10-CM | POA: Insufficient documentation

## 2014-05-16 MED ORDER — TETANUS-DIPHTH-ACELL PERTUSSIS 5-2.5-18.5 LF-MCG/0.5 IM SUSP
0.5000 mL | Freq: Once | INTRAMUSCULAR | Status: AC
  Administered 2014-05-16: 0.5 mL via INTRAMUSCULAR

## 2014-05-16 MED ORDER — CIPROFLOXACIN HCL 500 MG PO TABS: 500.0000 mg | ORAL_TABLET | Freq: Two times a day (BID) | ORAL | Status: DC

## 2014-05-16 MED ORDER — TYPHOID VI POLYSACCHARIDE VACC 25 MCG/0.5ML IM SOLN
0.5000 mL | Freq: Once | INTRAMUSCULAR | Status: AC
  Administered 2014-05-16: 0.5 mL via INTRAMUSCULAR

## 2014-05-16 MED ORDER — ATOVAQUONE-PROGUANIL HCL 250-100 MG PO TABS: 1.0000 | ORAL_TABLET | Freq: Every day | ORAL | Status: DC

## 2014-05-16 MED ORDER — AZITHROMYCIN 500 MG PO TABS: 1000.0000 mg | ORAL_TABLET | Freq: Once | ORAL | Status: DC

## 2014-05-16 NOTE — Patient Instructions (Signed)
[facility]                [address], [place]                   [place], [place] [address]                      Phone: [phone]                        Fax: [phone]   Planned departure date: July 10          Planned return date: 2 weeks Countries of travel: [place], [place] and [place]   Guidelines for the Prevention & Treatment of Traveler's Diarrhea  Prevention: "Boil it, Peel it, Zena it, or Forget it"   the fewer chances -> lower risk: try to stick to food & water precautions as much as possible"   If it's "piping hot"; it is probably okay, if not, it may not be   Treatment   1) You should always take care to drink lots of fluids in order to avoid dehydration   2) You should bring medications with you in case you come down with a case of diarrhea   3) OTC = bring pepto-bismol - can take with initial abdominal symptoms;                    Imodium - can help slow down your intestinal tract, can help relief cramps                    and diarrhea, can take if no bloody diarrhea  Use ciprofloxacin or azithromycin for the children if needed for traveler's diarrhea  Guidelines for the Prevention of Malaria  Avoidance:  -fewer mosquito bites = lower risk. Mosquitos can bite at night as well as daytime  -cover up (long sleeve clothing), mosquito nets, screens  -Insect repellent for your skin ( DEET containing lotion > 20%): for clothes ( permethrin spray)   On July 8 start malarone, daily dose starting 1-2 days before entering endemic area, ending 7 days after leaving area for malaria prevention.   Immunizations received today: Hepatitis A series, Td and Typhoid (parenteral)  Future immunizations, if indicated Hepatitis A series in 6 months   Prior to travel:  1) Be sure to pick up appropriate prescriptions, including medicine you take daily. Do not expect to be able to fill your prescriptions abroad.  2)  Strongly consider obtaining traveler's insurance, including emergency evacuation insurance. Most plans in the [place] do not cover participants abroad. (see below for resources)  3) Register at the appropriate [place] embassy or consulate with travel dates so they are aware of your presence in-country and for helpful advice during travel using the [employer] (STEP, GreenNylon.com.cy).  4) Leave contact information with a relative or friend.  5) Keep a Research officer, political party, credit cards in case they become lost or stolen  6) Inform your credit card company that you will be travelling abroad   During travel:  1) If you become ill and need medical advice, the [place] [employer]	[place] of the country you are traveling in general provides a list of [place] speaking doctors.  We are also available on MyChart for remote consultation if you register prior to travel. 2) Avoid motorcycles or scooters when at all  possible. Traffic laws in many countries are lax and accidents occur frequently.  3) Do not take any unnecessary risks that you wouldn't do at home.   Resources:  -Country specific information: BlindResource.ca or GreenNylon.com.cy  -Press photographer (DEET, mosquito nets): [employer], [employer] store, [employer], [place] insurance options: gatewayplans.com; http://clayton-rivera.info/; travelguard.com or Good [employer], gninsurance.com or info@gninsurance .com, [id number].   Post Travel:  If you return from your trip ill, call your primary care doctor or our travel clinic @ [phone].   Enjoy your trip and know that with proper pre-travel preparation, most people have an enjoyable and uninterrupted trip!

## 2014-05-16 NOTE — Progress Notes (Signed)
Subjective:   Briana Lopez is a 48 y.o. female who presents to the Infectious Disease clinic for travel consultation. Planned departure date: July 10          Planned return date: 2 weeks Countries of travel: [place], [place] and [place] Areas in country: safari   Accommodations: safari Purpose of travel: vacation Prior travel out of [place]: yes     Objective:   Medications: reviewed    Assessment:    No contraindications to travel. none     Plan:    Issues discussed: environmental concerns, future shots, insect-borne illnesses, malaria, MVA safety, rabies, safe food/water, traveler's diarrhea, website/handouts for more information, what to do if ill upon return and what to do if ill while there. Immunizations recommended: Hepatitis A series, Td and Typhoid (parenteral). Malaria prophylaxis: malarone, daily dose starting 1-2 days before entering endemic area, ending 7 days after leaving area Traveler's diarrhea prophylaxis: ciprofloxacin. Total duration of visit: 1 Hour. Total time spent on education, counseling, coordination of care: 30 Minutes.

## 2014-08-13 ENCOUNTER — Ambulatory Visit (INDEPENDENT_AMBULATORY_CARE_PROVIDER_SITE_OTHER): Payer: BLUE CROSS/BLUE SHIELD | Admitting: Family Medicine

## 2014-08-13 ENCOUNTER — Encounter: Payer: Self-pay | Admitting: Family Medicine

## 2014-08-13 VITALS — BP 110/60 | HR 66 | Ht 65.0 in | Wt 127.0 lb

## 2014-08-13 DIAGNOSIS — Z7189 Other specified counseling: Secondary | ICD-10-CM

## 2014-08-13 DIAGNOSIS — Z7184 Encounter for health counseling related to travel: Secondary | ICD-10-CM

## 2014-08-13 DIAGNOSIS — F419 Anxiety disorder, unspecified: Secondary | ICD-10-CM

## 2014-08-13 DIAGNOSIS — B373 Candidiasis of vulva and vagina: Secondary | ICD-10-CM | POA: Diagnosis not present

## 2014-08-13 DIAGNOSIS — B3731 Acute candidiasis of vulva and vagina: Secondary | ICD-10-CM

## 2014-08-13 MED ORDER — ALPRAZOLAM 0.5 MG PO TABS: 0.2500 mg | ORAL_TABLET | Freq: Three times a day (TID) | ORAL | Status: DC | PRN

## 2014-08-13 MED ORDER — FLUCONAZOLE 150 MG PO TABS: 150.0000 mg | ORAL_TABLET | Freq: Once | ORAL | Status: DC

## 2014-08-13 NOTE — Patient Instructions (Signed)
Take the diflucan today.  Repeat in 1 week if symptoms haven't completely resolved. If you need to use something topical, I would try a medicated powder (ie gold bond), or possibly vagisil for vaginal irritation (not the yeast kind, since we are treating the yeast with the pill).  I encourage you to take the malaria preventative medication as directed.

## 2014-08-13 NOTE — Progress Notes (Signed)
Chief Complaint  Patient presents with  . Vaginitis    started yesterday with itching and burns  . Medication Management    needs refill on xanax for flying   She started 2 days ago with vaginal itching and burning.  No significant discharge.  Monogamous relationship x 6 years. No recent antibiotics. Denies any sores. Denies abdominal/pelvic pain.  She was just in [place], hiking on the [place] for 10 days.  Sugars have been running a little high (intentionally, while hiking, afraid of hypoglycemia). It was very hot, some days went without panties.  One day she had panties that rubbed/irritated.  Used new/different soaps when in [place].  She is going to [place] Friday. She is unsure if she is going to take the anti-malarial medications that were prescribed. She is requesting refill of alprazolam to use for flying.  PMH, PSH, SH reviewed. Outpatient Encounter Prescriptions as of 08/13/2014  Medication Sig  . ALPRAZolam (XANAX) 0.5 MG tablet Take 0.5-1 tablets (0.25-0.5 mg total) by mouth 3 (three) times daily as needed for anxiety (for flying).  . insulin detemir (LEVEMIR) 100 UNIT/ML injection Inject 9 Units into the skin 2 (two) times daily.   . Insulin Glulisine (APIDRA SOLOSTAR IJ) Inject 1 Units as directed 5 (five) times daily as needed (uses 1 Unit per 15 carbs.).  Marland Kitchen Multiple Vitamins-Minerals (MULTIVITAMIN WITH MINERALS) tablet Take 1 tablet by mouth daily.  . Nutritional Supplements (NUTRITIONAL SUPPLEMENT PO) Take 2 capsules by mouth 2 (two) times daily. Thyroid supplement ok'd by endocrinologist.  . [DISCONTINUED] ALPRAZolam (XANAX) 0.5 MG tablet Take 0.25 mg by mouth at bedtime as needed (for flying).  Marland Kitchen atovaquone-proguanil (MALARONE) 250-100 MG TABS Take 1 tablet by mouth daily. Start 2 days prior to travel to malaria area, throughout travel and for 7 days upon return. (Patient not taking: Reported on 08/13/2014)  . fluconazole (DIFLUCAN) 150 MG tablet Take 1 tablet (150 mg  total) by mouth once. Take 1 tablet by mouth for yeast infection.  Repeat in 1 week if needed  . [DISCONTINUED] azithromycin (ZITHROMAX) 500 MG tablet Take 2 tablets (1,000 mg total) by mouth once. Take 2 tabs once for Traveler's diarrhea  . [DISCONTINUED] ciprofloxacin (CIPRO) 500 MG tablet Take 1 tablet (500 mg total) by mouth 2 (two) times daily. Take 1-2 days until diarrhea resolves   No facility-administered encounter medications on file as of 08/13/2014.   (diflucan was rx'd at visit, not taking prior).  Allergies  Allergen Reactions  . Penicillins     ROS: no fever, chills, URI symptoms, headache, GI complaints, urinary complaints, bleeding, bruising, or other complaints.  No depression, or anxiety except related to flying. See HPI.  PHYSICAL EXAM: BP 110/60 mmHg  Pulse 66  Ht 5\' 5"  (1.651 m)  Wt 127 lb (57.607 kg)  BMI 21.13 kg/m2  SpO2 99%  Well developed, well appearing female in no distress External genitalia:  There is some erythema in the entire area--small papules in pubic area where she just recently waxed. Erythema of labia minora and introitus, with very small amount of thick white discharge.  With speculum exam, normal vaginal mucosa, normal cervix, only small amount of very thin discharge, clear-white.  ASSESSMENT/PLAN:  Yeast vaginitis - likely related to heat/moisture and higher sugars. treat with diflucan. repeat in 1 week if needed - Plan: fluconazole (DIFLUCAN) 150 MG tablet  Anxiety - Plan: ALPRAZolam (XANAX) 0.5 MG tablet  Travel advice encounter - strongly encouraged use of malaria prevention (she already has  rx)   Treat presumptively for yeast infection.

## 2014-09-25 ENCOUNTER — Other Ambulatory Visit (HOSPITAL_COMMUNITY)
Admission: RE | Admit: 2014-09-25 | Discharge: 2014-09-25 | Disposition: A | Discharge status: Home / Self Care | Payer: BLUE CROSS/BLUE SHIELD | Source: Ambulatory Visit | Attending: Family Medicine | Admitting: Family Medicine

## 2014-09-25 ENCOUNTER — Encounter: Payer: Self-pay | Admitting: Family Medicine

## 2014-09-25 ENCOUNTER — Ambulatory Visit (INDEPENDENT_AMBULATORY_CARE_PROVIDER_SITE_OTHER): Payer: BLUE CROSS/BLUE SHIELD | Admitting: Family Medicine

## 2014-09-25 VITALS — BP 110/80 | HR 80 | Ht 64.75 in | Wt 129.6 lb

## 2014-09-25 DIAGNOSIS — Z5181 Encounter for therapeutic drug level monitoring: Secondary | ICD-10-CM | POA: Diagnosis not present

## 2014-09-25 DIAGNOSIS — E039 Hypothyroidism, unspecified: Secondary | ICD-10-CM | POA: Diagnosis not present

## 2014-09-25 DIAGNOSIS — Z1151 Encounter for screening for human papillomavirus (HPV): Secondary | ICD-10-CM | POA: Insufficient documentation

## 2014-09-25 DIAGNOSIS — E10649 Type 1 diabetes mellitus with hypoglycemia without coma: Secondary | ICD-10-CM

## 2014-09-25 DIAGNOSIS — Z0000 Encounter for general adult medical examination without abnormal findings: Secondary | ICD-10-CM

## 2014-09-25 DIAGNOSIS — R41840 Attention and concentration deficit: Secondary | ICD-10-CM | POA: Diagnosis not present

## 2014-09-25 DIAGNOSIS — E7800 Pure hypercholesterolemia, unspecified: Secondary | ICD-10-CM

## 2014-09-25 DIAGNOSIS — R7989 Other specified abnormal findings of blood chemistry: Secondary | ICD-10-CM

## 2014-09-25 DIAGNOSIS — Z01411 Encounter for gynecological examination (general) (routine) with abnormal findings: Secondary | ICD-10-CM | POA: Diagnosis not present

## 2014-09-25 DIAGNOSIS — R946 Abnormal results of thyroid function studies: Secondary | ICD-10-CM

## 2014-09-25 DIAGNOSIS — Z23 Encounter for immunization: Secondary | ICD-10-CM | POA: Diagnosis not present

## 2014-09-25 LAB — POCT URINALYSIS DIPSTICK
Bilirubin, UA: NEGATIVE
GLUCOSE UA: NEGATIVE
Ketones, UA: NEGATIVE
Leukocytes, UA: NEGATIVE
Nitrite, UA: NEGATIVE
PH UA: 6
Protein, UA: NEGATIVE
SPEC GRAV UA: 1.02
UROBILINOGEN UA: NEGATIVE

## 2014-09-25 NOTE — Patient Instructions (Signed)
  HEALTH MAINTENANCE RECOMMENDATIONS:  It is recommended that you get at least 30 minutes of aerobic exercise at least 5 days/week (for weight loss, you may need as much as 60-90 minutes). This can be any activity that gets your heart rate up. This can be divided in 10-15 minute intervals if needed, but try and build up your endurance at least once a week.  Weight bearing exercise is also recommended twice weekly.  Eat a healthy diet with lots of vegetables, fruits and fiber.  "Colorful" foods have a lot of vitamins (ie green vegetables, tomatoes, red peppers, etc).  Limit sweet tea, regular sodas and alcoholic beverages, all of which has a lot of calories and sugar.  Up to 1 alcoholic drink daily may be beneficial for women (unless trying to lose weight, watch sugars).  Drink a lot of water.  Calcium recommendations are 1200-1500 mg daily (1500 mg for postmenopausal women or women without ovaries), and vitamin D 1000 IU daily.  This should be obtained from diet and/or supplements (vitamins), and calcium should not be taken all at once, but in divided doses.  Monthly self breast exams and yearly mammograms for women over the age of 87 is recommended.  Sunscreen of at least SPF 30 should be used on all sun-exposed parts of the skin when outside between the hours of 10 am and 4 pm (not just when at beach or pool, but even with exercise, golf, tennis, and yard work!)  Use a sunscreen that says "broad spectrum" so it covers both UVA and UVB rays, and make sure to reapply every 1-2 hours.  Remember to change the batteries in your smoke detectors when changing your clock times in the spring and fall.  Use your seat belt every time you are in a car, and please drive safely and not be distracted with cell phones and texting while driving.  Please call to schedule your yearly mammogram. Call to schedule routine follow up with Dr. Roberto Scales.  Consider seeing Dr. [facility]e Sim[facility] at [facility]) for ADD  evaluation/testing. Try and work on time management--doing little at a time rather than saving it all up and getting overwhelmed.  Last of the Hepatitis A series is due in October. You can schedule a nurse visit here

## 2014-09-25 NOTE — Progress Notes (Signed)
Chief Complaint  Patient presents with  . Annual Exam    nonfasting annual exam with pap (started menstrual cycle this morning). Would like to know if you can check her hormones today. Also requesting rx for adderall.    Briana Lopez is a 49 y.o. female who presents for a complete physical.  She has the following concerns:  Asking about hormones simply due to her age.  Denies symptoms--menses are regular each month, no hot flashes.  Some night sweats but is related to low blood sugars.  This is less often since she is on the insulin pump.  Her three children have ADD, only one of whom is on Ritalin. She has taken her son's ritalin in the past, when she has to get a lot of paperwork done. It "makes her mean as a snake" later that afternoon, but allowed her to get through the pile of paperwork that accumulates.  She is asking for Adderall to take when she needs to get her paperwork done.  She is asking for that rather than Ritalin, hoping she wouldn't have the same side effect (she has a friend taking it for similar reasons).  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  She reports sometimes she can be snappy and jumpy, never mean like the two times she took the ritalin.  She was seen last month for yeast vaginitis, treated with diflucan. Symptoms resolved She went to [place]. She had gone to ID clinic for travel vaccines in April.  She had a great trip, with no illnesses.  Sees Dr. Roberto Scales at [facility] for diabetes care. She is now on an insulin pump.  Last A1c was 6.5 per patient.  She was started on thyroid medication in 2015 (she initially stated it was 64mcg, but let us know the following day when she returned for labs that it was actually 60mcg).  She has refused statins in the past (and was told that her levels were good).   Looked at [facility], and it turns out that her last labs were 06/2013. Admits she  hasn't been seen there in the last year, since she was doing so much better since on the pump.  She does not have a scheduled appointment.  She reports her "bowels are a wreck"--either constipated or "crazy diarrhea". She had watery diarrhea when she was in [place]. She started probiotics when she returned from her trip, and stool consistency has improved.  If she doesn't take them, she is constipated. Denies bloody stools, abdominal pain or mucus in the stool  Immunization History  Administered Date(s) Administered  . Hepatitis A, Adult 05/16/2014  . Tdap 04/05/2010, 05/16/2014  . Typhoid Inactivated 05/16/2014  she reports she has had pneumonia vaccine through Dr. Roberto Scales in the past (5 years?) Last Pap smear: 03/2010 Last mammogram: 09/2012 Last colonoscopy: never Last DEXA: never Dentist: 4 times/yr Ophtho: 02/2014 (had been late, not getting yearly prior to that, but UTD now). Exercise: daily--Crossfit, runs, walks.  Weight-bearing 3-4x/week. Uses exercise as a tool when she needs to get her sugar down also  Normal vitamin D level in 07/2011  Past Medical History  Diagnosis Date  . Diabetes mellitus type 1 2000    Dr. Roberto Scales [facility])  . Pure hypercholesterolemia   . Migraines     with pregnancy    History reviewed. No pertinent past surgical history.  Social History   Social History  .  Marital Status: Divorced    Spouse Name: N/A  . Number of Children: 3  . Years of Education: N/A   Occupational History  . hair stylist    Social History Main Topics  . Smoking status: Never Smoker   . Smokeless tobacco: Never Used  . Alcohol Use: 0.0 oz/week    0 Standard drinks or equivalent per week     Comment: 1 glass of wine each evening with dinner, 2nd glass on weekends (2-3 days/week)  . Drug Use: No  . Sexual Activity:    Partners: Male     Comment: boyfriend had vasectomy   Other Topics Concern  . Not on file   Social History Narrative   Divorced.  Lives  with 3 children (only 1 son currently lives at home, others are off at [place]), 2 dogs and a Neurosurgeon    Family History  Problem Relation Age of Onset  . Cancer Father     lung cancer  . Hypertension Father   . Alcohol abuse Brother   . Cancer Maternal Grandmother     colon cancer in 9's  . Cancer Paternal Grandmother     breast cancer in her 54's  . Heart disease Paternal Grandmother   . Diabetes Neg Hx     Outpatient Encounter Prescriptions as of 09/25/2014  Medication Sig Note  . insulin aspart (NOVOLOG) 100 UNIT/ML injection Inject into the skin 3 (three) times daily before meals. 09/25/2014: Sliding scale before meals; also on insulin pump now  . levothyroxine (SYNTHROID, LEVOTHROID) 25 MCG tablet Take 25 mcg by mouth daily before breakfast.   . Multiple Vitamins-Minerals (ALIVE WOMENS 50+ PO) Take 1 tablet by mouth daily.   . Multiple Vitamins-Minerals (MULTIVITAMIN WITH MINERALS) tablet Take 1 tablet by mouth daily.   . Probiotic Product (PROBIOTIC DAILY PO) Take 1 tablet by mouth daily.   Marland Kitchen ALPRAZolam (XANAX) 0.5 MG tablet Take 0.5-1 tablets (0.25-0.5 mg total) by mouth 3 (three) times daily as needed for anxiety (for flying). (Patient not taking: Reported on 09/25/2014)   . [DISCONTINUED] atovaquone-proguanil (MALARONE) 250-100 MG TABS Take 1 tablet by mouth daily. Start 2 days prior to travel to malaria area, throughout travel and for 7 days upon return. (Patient not taking: Reported on 08/13/2014)   . [DISCONTINUED] fluconazole (DIFLUCAN) 150 MG tablet Take 1 tablet (150 mg total) by mouth once. Take 1 tablet by mouth for yeast infection.  Repeat in 1 week if needed   . [DISCONTINUED] insulin detemir (LEVEMIR) 100 UNIT/ML injection Inject 9 Units into the skin 2 (two) times daily.    . [DISCONTINUED] Insulin Glulisine (APIDRA SOLOSTAR IJ) Inject 1 Units as directed 5 (five) times daily as needed (uses 1 Unit per 15 carbs.).   . [DISCONTINUED] Nutritional  Supplements (NUTRITIONAL SUPPLEMENT PO) Take 2 capsules by mouth 2 (two) times daily. Thyroid supplement ok'd by endocrinologist.    No facility-administered encounter medications on file as of 09/25/2014.  (thyroid dose is actually 55mcg, per updated info given 8/19)  Allergies  Allergen Reactions  . Penicillins     ROS: The patient denies anorexia, fever, weight changes, headaches, vision changes, decreased hearing, ear pain, sore throat, breast concerns, chest pain, palpitations, dizziness, syncope, dyspnea on exertion, cough, swelling, nausea, vomiting, abdominal pain, melena, hematochezia, indigestion/heartburn, hematuria, incontinence, dysuria, irregular menstrual cycles, vaginal discharge, odor or itch, genital lesions, joint pains (has ongoing discomfort/soreness to knees and hips related to her exercise, but no acute/severe problems);  no numbness, tingling, weakness, tremor, suspicious skin lesions, depression, anxiety, abnormal bleeding/bruising, or enlarged lymph nodes. Some worsening of distance vision noted (only wears her glasses driving). +constipation and diarrhea per HPI. No black/bloody/mucus in stools Slight anxiety, managed by exercise and 1/2 xanax if severe (less than once a month, occasionally if severe PMS/irritability; usually only uses xanax for flying)  PHYSICAL EXAM:  BP 110/80 mmHg  Pulse 80  Ht 5' 4.75" (1.645 m)  Wt 129 lb 9.6 oz (58.786 kg)  BMI 21.72 kg/m2  LMP 09/25/2014  General Appearance:   Alert, cooperative, no distress, appears stated age  Head:   Normocephalic, without obvious abnormality, atraumatic  Eyes:   PERRL, conjunctiva/corneas clear, EOM's intact, fundi   benign.  Wearing thick fake eyelashes  Ears:   Normal TM's and external ear canals  Nose:  Nares normal, mucosa normal, no drainage or sinus tenderness  Throat:  Lips, mucosa, and tongue normal; teeth and gums normal  Neck:  Supple, no lymphadenopathy; thyroid:  no enlargement/tenderness/nodules; no carotid  bruit or JVD  Back:  Spine nontender, no curvature, ROM normal, no CVA tenderness  Lungs:   Clear to auscultation bilaterally without wheezes, rales or ronchi; respirations unlabored  Chest Wall:   No tenderness or deformity  Heart:   Regular rate and rhythm, S1 and S2 normal, no murmur, rub  or gallop  Breast Exam:   No tenderness, masses, or nipple discharge or inversion. No axillary lymphadenopathy  Abdomen:   Soft, non-tender, nondistended, normoactive bowel sounds,   no masses, no hepatosplenomegaly  Genitalia:   Normal external genitalia without lesions. BUS and vagina normal; no cervical motion tenderness. Cervix appears normal, but partly obscured by blood. No abnormal vaginal discharge, blood in vaginal vault. Uterus and adnexa not enlarged, nontender, no masses. Pap performed  Rectal:   declined/refused  Extremities:  No clubbing, cyanosis or edema  Pulses:  2+ and symmetric all extremities  Skin:  Skin color, texture, turgor normal, no rashes or lesions  Lymph nodes:  Cervical, supraclavicular, and axillary nodes normal  Neurologic:  CNII-XII intact, normal strength, sensation and gait; reflexes 2+ and symmetric throughout   Psych: Normal mood, affect, hygiene and grooming.        ASSESSMENT/PLAN:  Annual physical exam - Plan: POCT Urinalysis Dipstick, Cytology - PAP [place]  Pure hypercholesterolemia - not on statin, due for check (will refuse statin) - Plan: Lipid panel  Elevated TSH - Plan: TSH  Type 1 diabetes mellitus with hypoglycemia and without coma - past due for f/u with endocrinologist. Return for fasting labs, and we will forward to Dr. Roberto Scales.  Schedule f/u appt with him - Plan: Lipid panel, Comprehensive metabolic panel, Microalbumin / creatinine urine ratio  Medication monitoring encounter - Plan: CBC with  Differential/Platelet, TSH, Comprehensive metabolic panel  Hypothyroidism, unspecified hypothyroidism type - check TSH; will forward copies to Dr. Roberto Scales - Plan: TSH  Poor concentration - procrastination; wanting stimulant. likely had ADD as a child, but do not think that meds are appropriate now. Anxiety/stress/overwhelmed contributing.   Sounds like probably ADD as child, not treated, but has been able to cope quite well until just recently.  Likely has component of anxiety/stress/overwhelmed-- Not enough time.  Spoke of using phone alarm, doing a little each day rather than saving it up. Adderall may have same effects as Ritalin, and I don't feel that stimulants are appropriate for this use. Briefly mentioned wellbutrin as an alternative treatment for ADD/anxiety, but would require daily  med.  She doesn't really want to take more meds daily. Testing might be a good idea--given name for [facility] vs testing through psychologists  Discussed monthly self breast exams and yearly mammograms (past due and reminded to schedule); at least 30 minutes of aerobic activity at least 5 days/week, weight bearing exercise at least 2x/wk; proper sunscreen use reviewed; healthy diet, including goals of calcium and vitamin D intake and alcohol recommendations (less than or equal to 1 drink/day) reviewed; regular seatbelt use; changing batteries in smoke detectors. Immunization recommendations discussed--yearly flu shots recommended--finally convinced to get one today.  If she had pneumonia vaccine through endocrinologist, then imms UTD. Advised that she can get second Hep A vaccine here in October (rather than ID clinic). Colonoscopy recommendations reviewed--age 65   Return for fasting labs, given that she is past due for f/u on her DM and thyroid with Dr. Roberto Scales.  Will send copies to him, and reminded her to schedule appointment. Re: vision loss--is with distance vision, and exam is UTD.  Ddx reviewed (related to  blood sugars, astigmatism)--likely just needs to wear her glasses more regularly. F/u with ophtho yearly, sooner if worsening vision.   F/u physical 1 year; fasting labs tomorrow F/u with endocrinologist

## 2014-09-26 ENCOUNTER — Telehealth: Payer: Self-pay | Admitting: Family Medicine

## 2014-09-26 ENCOUNTER — Other Ambulatory Visit: Payer: BLUE CROSS/BLUE SHIELD

## 2014-09-26 DIAGNOSIS — R7989 Other specified abnormal findings of blood chemistry: Secondary | ICD-10-CM

## 2014-09-26 DIAGNOSIS — E7800 Pure hypercholesterolemia, unspecified: Secondary | ICD-10-CM

## 2014-09-26 DIAGNOSIS — E039 Hypothyroidism, unspecified: Secondary | ICD-10-CM

## 2014-09-26 DIAGNOSIS — E10649 Type 1 diabetes mellitus with hypoglycemia without coma: Secondary | ICD-10-CM

## 2014-09-26 DIAGNOSIS — Z5181 Encounter for therapeutic drug level monitoring: Secondary | ICD-10-CM

## 2014-09-26 LAB — CBC WITH DIFFERENTIAL/PLATELET
Basophils Absolute: 0 10*3/uL (ref 0.0–0.1)
Basophils Relative: 0 % (ref 0–1)
EOS ABS: 0 10*3/uL (ref 0.0–0.7)
EOS PCT: 1 % (ref 0–5)
HEMATOCRIT: 39.7 % (ref 36.0–46.0)
Hemoglobin: 12.7 g/dL (ref 12.0–15.0)
LYMPHS ABS: 0.7 10*3/uL (ref 0.7–4.0)
LYMPHS PCT: 17 % (ref 12–46)
MCH: 28.5 pg (ref 26.0–34.0)
MCHC: 32 g/dL (ref 30.0–36.0)
MCV: 89.2 fL (ref 78.0–100.0)
MONO ABS: 0.3 10*3/uL (ref 0.1–1.0)
MONOS PCT: 8 % (ref 3–12)
MPV: 9 fL (ref 8.6–12.4)
Neutro Abs: 3 10*3/uL (ref 1.7–7.7)
Neutrophils Relative %: 74 % (ref 43–77)
PLATELETS: 299 10*3/uL (ref 150–400)
RBC: 4.45 MIL/uL (ref 3.87–5.11)
RDW: 14.9 % (ref 11.5–15.5)
WBC: 4.1 10*3/uL (ref 4.0–10.5)

## 2014-09-26 LAB — LIPID PANEL
Cholesterol: 217 mg/dL — ABNORMAL HIGH (ref 125–200)
HDL: 94 mg/dL (ref >=46)
LDL CALC: 110 mg/dL (ref <130)
Total CHOL/HDL Ratio: 2.3 Ratio (ref <=5.0)
Triglycerides: 64 mg/dL (ref <150)
VLDL: 13 mg/dL (ref <30)

## 2014-09-26 LAB — COMPREHENSIVE METABOLIC PANEL
ALT: 15 U/L (ref 6–29)
AST: 17 U/L (ref 10–35)
Albumin: 3.9 g/dL (ref 3.6–5.1)
Alkaline Phosphatase: 41 U/L (ref 33–115)
BUN: 12 mg/dL (ref 7–25)
CHLORIDE: 104 mmol/L (ref 98–110)
CO2: 28 mmol/L (ref 20–31)
CREATININE: 0.76 mg/dL (ref 0.50–1.10)
Calcium: 8.6 mg/dL (ref 8.6–10.2)
GLUCOSE: 85 mg/dL (ref 65–99)
POTASSIUM: 4.1 mmol/L (ref 3.5–5.3)
SODIUM: 140 mmol/L (ref 135–146)
TOTAL PROTEIN: 6.3 g/dL (ref 6.1–8.1)
Total Bilirubin: 0.8 mg/dL (ref 0.2–1.2)

## 2014-09-26 NOTE — Telephone Encounter (Signed)
Pt came in for labs today and while she was here she told me that the thyroid medication she is taking is 50 mg not 25 mg. She ask me to please let her know.

## 2014-09-26 NOTE — Telephone Encounter (Signed)
Noted and med list updated.

## 2014-09-27 LAB — TSH: TSH: 1.818 u[IU]/mL (ref 0.350–4.500)

## 2014-09-27 LAB — MICROALBUMIN / CREATININE URINE RATIO
Creatinine, Urine: 78.2 mg/dL
MICROALB UR: 0.2 mg/dL (ref <2.0)
Microalb Creat Ratio: 2.6 mg/g (ref 0.0–30.0)

## 2014-09-29 NOTE — Addendum Note (Signed)
Addended by: Carolee Rota F on: 09/29/2014 11:32 AM   Modules accepted: Orders

## 2014-09-30 LAB — CYTOLOGY - PAP

## 2014-10-01 ENCOUNTER — Encounter: Payer: Self-pay | Admitting: Family Medicine

## 2014-10-03 ENCOUNTER — Other Ambulatory Visit (INDEPENDENT_AMBULATORY_CARE_PROVIDER_SITE_OTHER): Payer: BLUE CROSS/BLUE SHIELD

## 2014-10-03 DIAGNOSIS — E109 Type 1 diabetes mellitus without complications: Secondary | ICD-10-CM | POA: Diagnosis not present

## 2014-10-03 LAB — POCT GLYCOSYLATED HEMOGLOBIN (HGB A1C): Hemoglobin A1C: 7.5

## 2014-11-11 ENCOUNTER — Telehealth: Payer: Self-pay

## 2014-11-11 NOTE — Telephone Encounter (Signed)
Medical records received from [facility] on 11/11/14

## 2014-11-17 ENCOUNTER — Encounter: Payer: Self-pay | Admitting: Family Medicine

## 2015-07-03 IMAGING — US US TRANSVAGINAL NON-OB
1 series · 14 of 25 positions shown · non-contrast
Comparison: None

CLINICAL DATA: Right adnexal pain.



[Series 1: us transvaginal non-ob · 0.32mm/px · 14 of 62 slices shown]
[im 1/62]
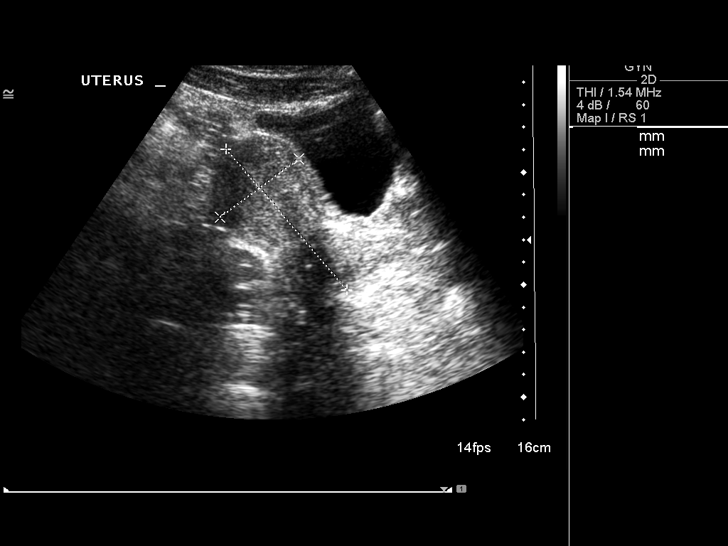
[im 6/62]
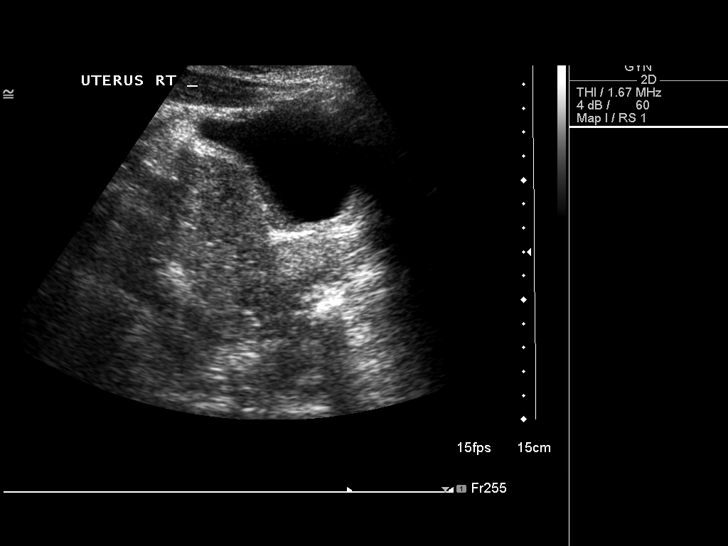
[im 11/62]
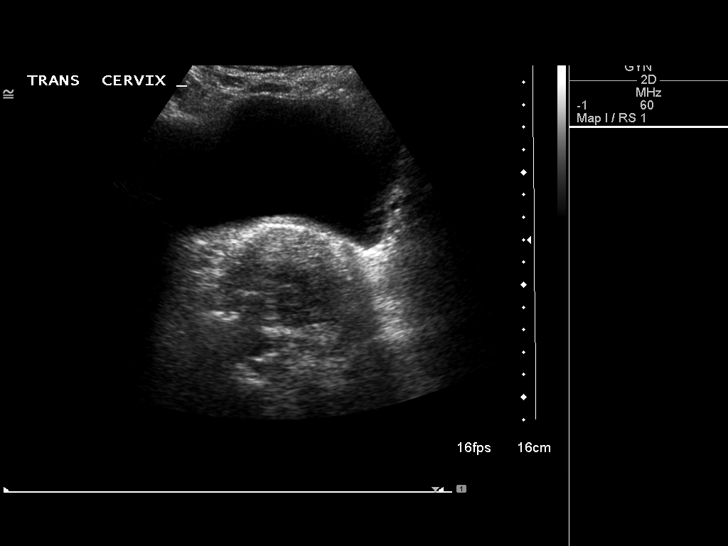
[im 16/62]
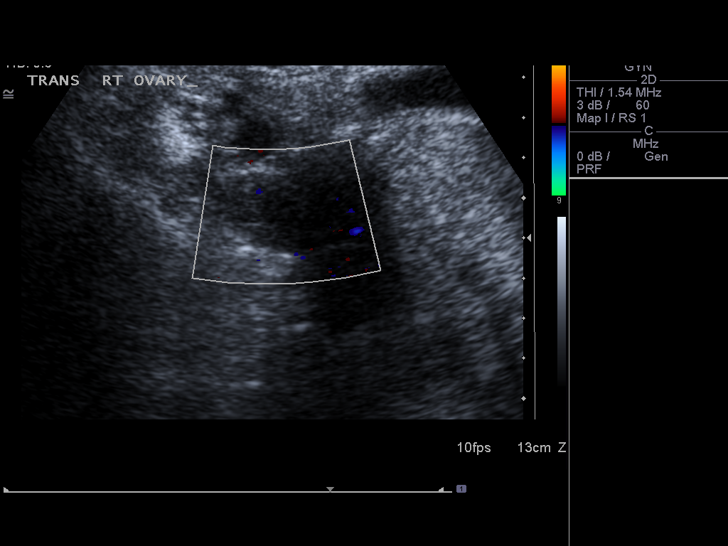
[im 21/62]
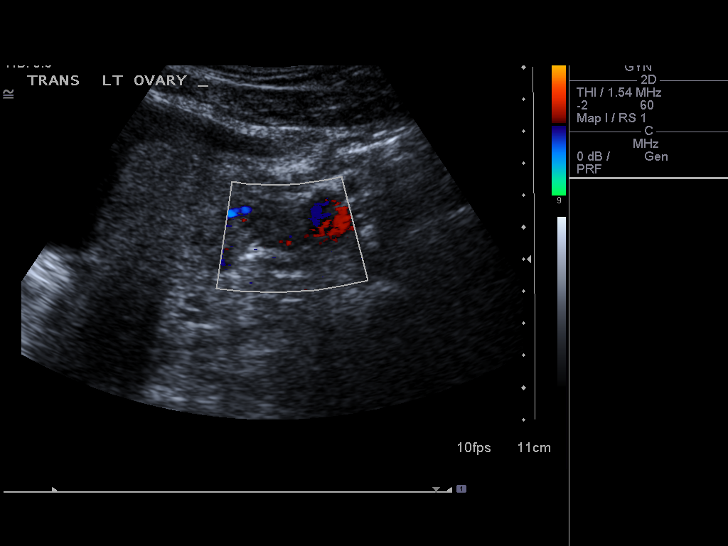
[im 23/62]
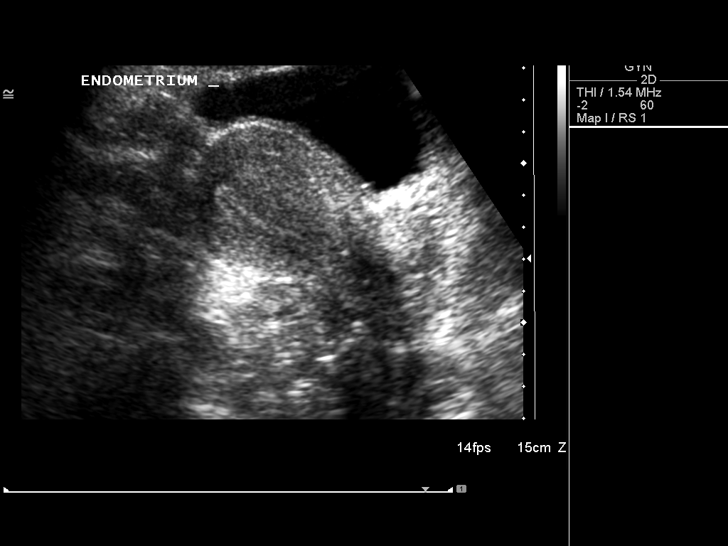
[im 28/62]
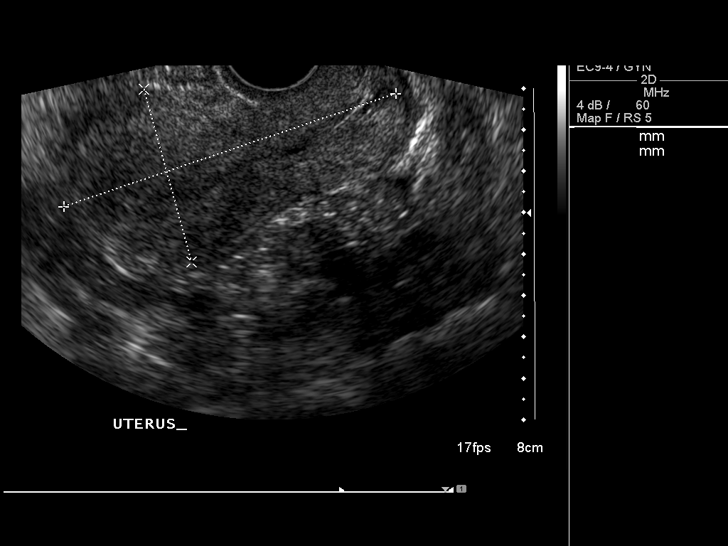
[im 34/62]
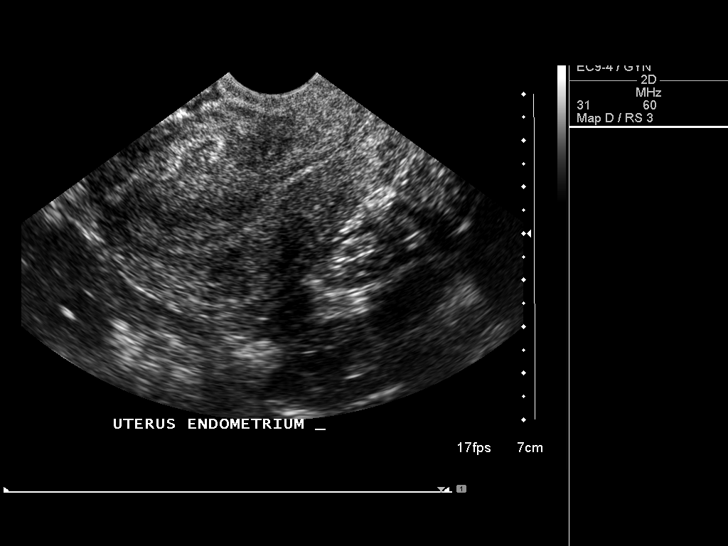
[im 39/62]
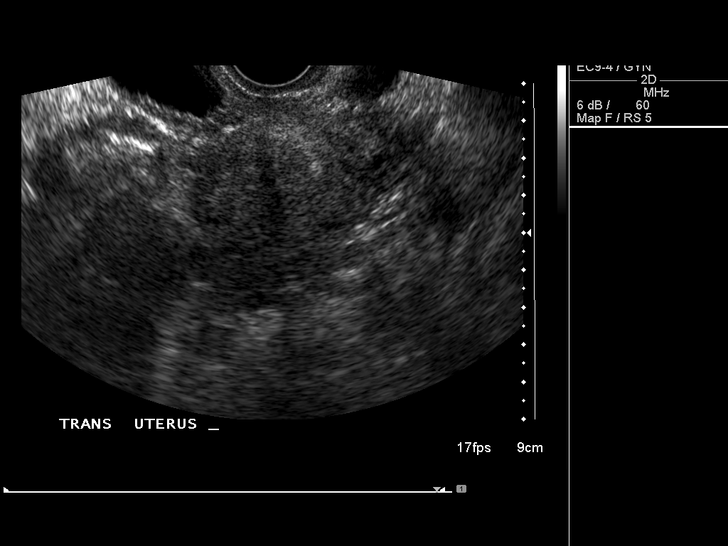
[im 41/62]
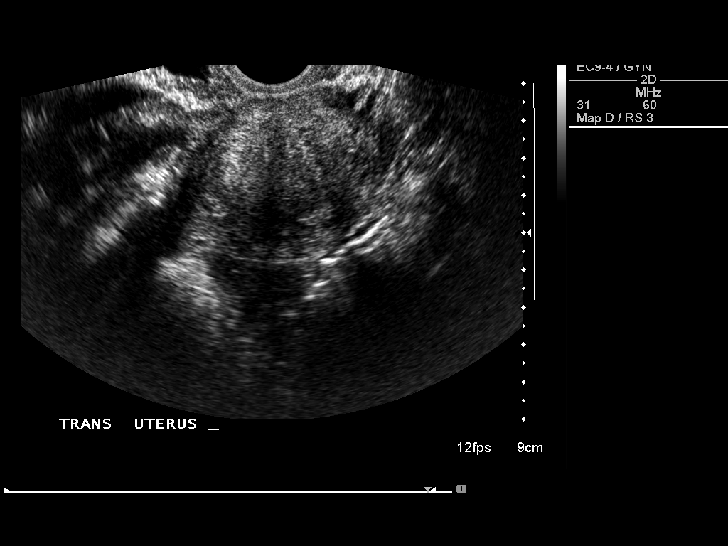
[im 46/62]
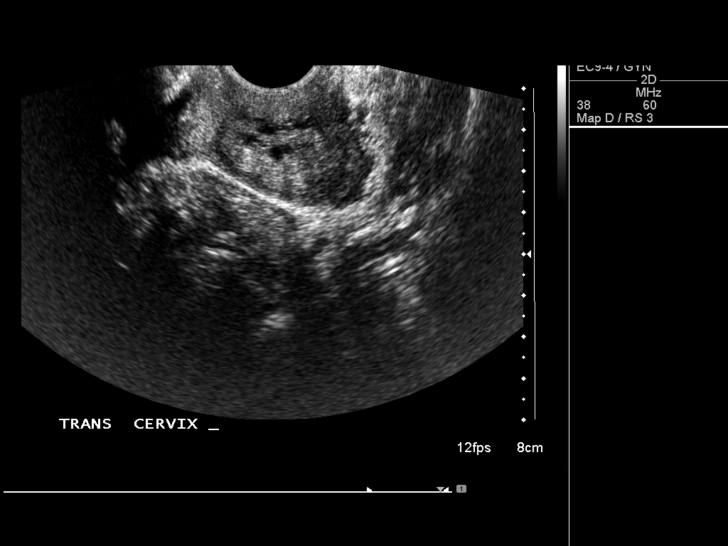
[im 51/62]
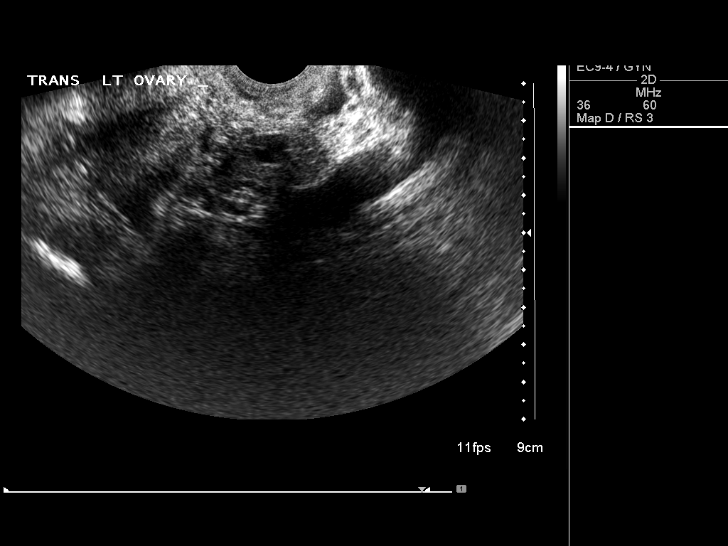
[im 56/62]
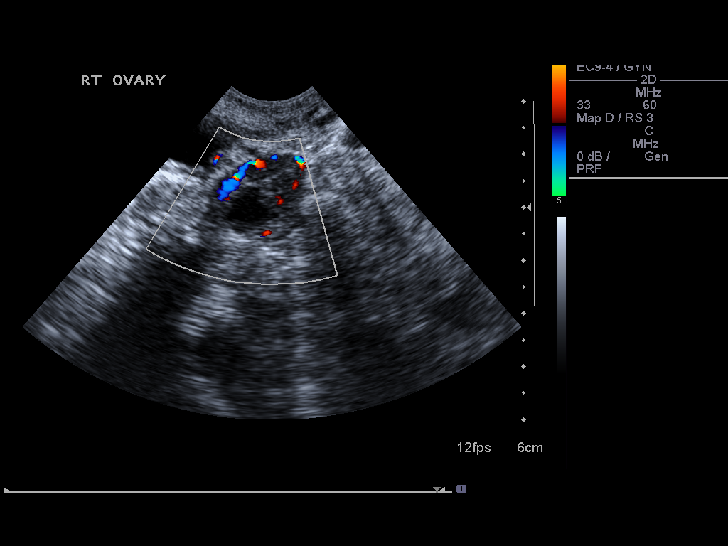
[im 62/62]
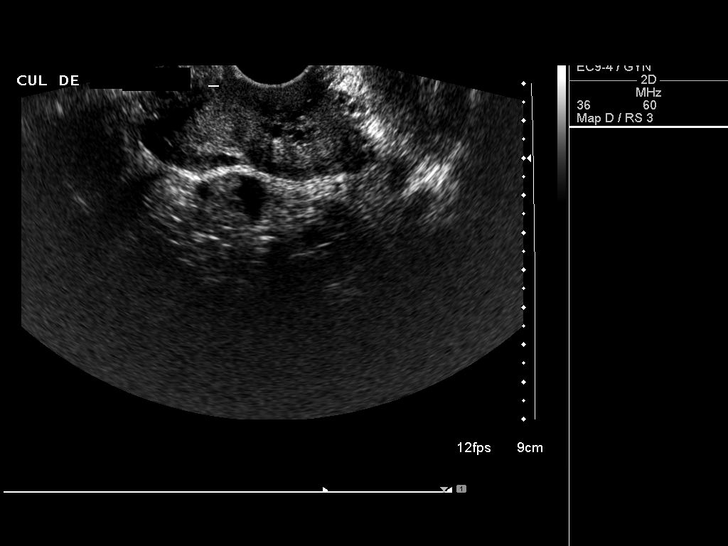

[14 of 25 positions shown; findings below may reference images not displayed]

FINDINGS: Uterus: Suspect bicornuate or septate uterus.  Normal in size
measuring 8.5 x 4.3 x 4.9 cm.

Endometrium: Normal in thickness measuring a maximum of 8.6 mm.

Right ovary:  Measures 2.3 x 1.6 x 2.8 cm.  A 1.2 x 1.4 x 2.2 cm
collapsed cyst is noted.

Left ovary: Measures 2.0 x 1.6 x 2.3 cm.  No cysts or masses.

Other findings: No free fluid
IMPRESSION: 1.  Suspect bicornuate or septate uterus.
2.  Normal ovaries except for a collapsed cyst on the right.
3.  Normal endometrial thickness..

## 2015-12-18 ENCOUNTER — Emergency Department (HOSPITAL_COMMUNITY): Payer: BLUE CROSS/BLUE SHIELD

## 2015-12-18 ENCOUNTER — Encounter (HOSPITAL_COMMUNITY): Payer: Self-pay | Admitting: Emergency Medicine

## 2015-12-18 ENCOUNTER — Ambulatory Visit (INDEPENDENT_AMBULATORY_CARE_PROVIDER_SITE_OTHER): Payer: BLUE CROSS/BLUE SHIELD | Admitting: Family Medicine

## 2015-12-18 ENCOUNTER — Inpatient Hospital Stay (HOSPITAL_COMMUNITY)
Admission: EM | Admit: 2015-12-18 | Discharge: 2015-12-19 | DRG: 638 | Disposition: A | Discharge status: Home / Self Care | Payer: BLUE CROSS/BLUE SHIELD | Attending: Family Medicine | Admitting: Family Medicine

## 2015-12-18 VITALS — BP 110/70 | HR 109 | Temp 97.4°F

## 2015-12-18 DIAGNOSIS — Z9641 Presence of insulin pump (external) (internal): Secondary | ICD-10-CM | POA: Diagnosis present

## 2015-12-18 DIAGNOSIS — E860 Dehydration: Secondary | ICD-10-CM | POA: Diagnosis present

## 2015-12-18 DIAGNOSIS — F419 Anxiety disorder, unspecified: Secondary | ICD-10-CM | POA: Diagnosis not present

## 2015-12-18 DIAGNOSIS — E109 Type 1 diabetes mellitus without complications: Secondary | ICD-10-CM | POA: Diagnosis not present

## 2015-12-18 DIAGNOSIS — N179 Acute kidney failure, unspecified: Secondary | ICD-10-CM

## 2015-12-18 DIAGNOSIS — E872 Acidosis, unspecified: Secondary | ICD-10-CM

## 2015-12-18 DIAGNOSIS — Z794 Long term (current) use of insulin: Secondary | ICD-10-CM

## 2015-12-18 DIAGNOSIS — E039 Hypothyroidism, unspecified: Secondary | ICD-10-CM | POA: Diagnosis not present

## 2015-12-18 DIAGNOSIS — E875 Hyperkalemia: Secondary | ICD-10-CM | POA: Diagnosis present

## 2015-12-18 DIAGNOSIS — Z87891 Personal history of nicotine dependence: Secondary | ICD-10-CM | POA: Diagnosis not present

## 2015-12-18 DIAGNOSIS — E1010 Type 1 diabetes mellitus with ketoacidosis without coma: Secondary | ICD-10-CM | POA: Diagnosis not present

## 2015-12-18 DIAGNOSIS — D72829 Elevated white blood cell count, unspecified: Secondary | ICD-10-CM

## 2015-12-18 DIAGNOSIS — Z8249 Family history of ischemic heart disease and other diseases of the circulatory system: Secondary | ICD-10-CM

## 2015-12-18 DIAGNOSIS — E7800 Pure hypercholesterolemia, unspecified: Secondary | ICD-10-CM | POA: Diagnosis not present

## 2015-12-18 DIAGNOSIS — R112 Nausea with vomiting, unspecified: Secondary | ICD-10-CM | POA: Diagnosis not present

## 2015-12-18 DIAGNOSIS — K760 Fatty (change of) liver, not elsewhere classified: Secondary | ICD-10-CM | POA: Diagnosis not present

## 2015-12-18 DIAGNOSIS — E1110 Type 2 diabetes mellitus with ketoacidosis without coma: Secondary | ICD-10-CM | POA: Diagnosis present

## 2015-12-18 LAB — BLOOD GAS, VENOUS
Acid-base deficit: 25.1 mmol/L — ABNORMAL HIGH (ref 0.0–2.0)
Bicarbonate: 5.5 mmol/L — ABNORMAL LOW (ref 20.0–28.0)
O2 Saturation: 57.1 %
PH VEN: 7.04 — AB (ref 7.250–7.430)
Patient temperature: 98.6
pCO2, Ven: 21.5 mmHg — ABNORMAL LOW (ref 44.0–60.0)
pO2, Ven: 38 mmHg (ref 32.0–45.0)

## 2015-12-18 LAB — COMPREHENSIVE METABOLIC PANEL
ALT: 27 U/L (ref 14–54)
AST: 30 U/L (ref 15–41)
Albumin: 4.9 g/dL (ref 3.5–5.0)
Alkaline Phosphatase: 72 U/L (ref 38–126)
Anion gap: 23 — ABNORMAL HIGH (ref 5–15)
BUN: 34 mg/dL — ABNORMAL HIGH (ref 6–20)
CHLORIDE: 107 mmol/L (ref 101–111)
CO2: 8 mmol/L — ABNORMAL LOW (ref 22–32)
Calcium: 9.3 mg/dL (ref 8.9–10.3)
Creatinine, Ser: 1.39 mg/dL — ABNORMAL HIGH (ref 0.44–1.00)
GFR, EST AFRICAN AMERICAN: 51 mL/min — AB (ref >60)
GFR, EST NON AFRICAN AMERICAN: 44 mL/min — AB (ref >60)
Glucose, Bld: 338 mg/dL — ABNORMAL HIGH (ref 65–99)
POTASSIUM: 5.9 mmol/L — AB (ref 3.5–5.1)
SODIUM: 138 mmol/L (ref 135–145)
Total Bilirubin: 1.3 mg/dL — ABNORMAL HIGH (ref 0.3–1.2)
Total Protein: 8.2 g/dL — ABNORMAL HIGH (ref 6.5–8.1)

## 2015-12-18 LAB — CBG MONITORING, ED
GLUCOSE-CAPILLARY: 287 mg/dL — AB (ref 65–99)
Glucose-Capillary: 261 mg/dL — ABNORMAL HIGH (ref 65–99)
Glucose-Capillary: 249 mg/dL — ABNORMAL HIGH (ref 65–99)

## 2015-12-18 LAB — URINALYSIS, ROUTINE W REFLEX MICROSCOPIC
Bilirubin Urine: NEGATIVE
LEUKOCYTES UA: NEGATIVE
Nitrite: NEGATIVE
PH: 5.5 (ref 5.0–8.0)
Protein, ur: 30 mg/dL — AB
Specific Gravity, Urine: 1.027 (ref 1.005–1.030)

## 2015-12-18 LAB — I-STAT BETA HCG BLOOD, ED (MC, WL, AP ONLY)

## 2015-12-18 LAB — LIPASE, BLOOD: LIPASE: 12 U/L (ref 11–51)

## 2015-12-18 LAB — URINE MICROSCOPIC-ADD ON

## 2015-12-18 LAB — I-STAT CG4 LACTIC ACID, ED
LACTIC ACID, VENOUS: 3.94 mmol/L — AB (ref 0.5–1.9)
LACTIC ACID, VENOUS: 2.23 mmol/L — AB (ref 0.5–1.9)

## 2015-12-18 LAB — CBC
HEMATOCRIT: 48.9 % — AB (ref 36.0–46.0)
Hemoglobin: 15.9 g/dL — ABNORMAL HIGH (ref 12.0–15.0)
MCH: 30.3 pg (ref 26.0–34.0)
MCHC: 32.5 g/dL (ref 30.0–36.0)
MCV: 93.3 fL (ref 78.0–100.0)
Platelets: 345 10*3/uL (ref 150–400)
RBC: 5.24 MIL/uL — ABNORMAL HIGH (ref 3.87–5.11)
RDW: 14.8 % (ref 11.5–15.5)
WBC: 18.1 10*3/uL — AB (ref 4.0–10.5)

## 2015-12-18 MED ORDER — SODIUM CHLORIDE 0.9 % IV BOLUS (SEPSIS)
1500.0000 mL | Freq: Once | INTRAVENOUS | Status: AC
  Administered 2015-12-18: 1500 mL via INTRAVENOUS

## 2015-12-18 MED ORDER — DEXTROSE-NACL 5-0.45 % IV SOLN: INTRAVENOUS | Status: DC

## 2015-12-18 MED ORDER — SODIUM CHLORIDE 0.9 % IV SOLN
INTRAVENOUS | Status: DC
  Administered 2015-12-18: 1.9 [IU]/h via INTRAVENOUS
  Filled 2015-12-18: qty 2.5

## 2015-12-18 MED ORDER — LEVOTHYROXINE SODIUM 25 MCG PO TABS
25.0000 ug | ORAL_TABLET | Freq: Every day | ORAL | Status: DC
  Administered 2015-12-19: 25 ug via ORAL
  Filled 2015-12-18: qty 1

## 2015-12-18 MED ORDER — ONDANSETRON 4 MG PO TBDP
4.0000 mg | ORAL_TABLET | Freq: Once | ORAL | Status: AC | PRN
  Administered 2015-12-18: 4 mg via ORAL
  Filled 2015-12-18: qty 1

## 2015-12-18 MED ORDER — MORPHINE SULFATE (PF) 2 MG/ML IV SOLN: 1.0000 mg | INTRAVENOUS | Status: DC | PRN

## 2015-12-18 MED ORDER — ONDANSETRON HCL 4 MG/2ML IJ SOLN
4.0000 mg | Freq: Once | INTRAMUSCULAR | Status: AC
  Administered 2015-12-18: 4 mg via INTRAVENOUS
  Filled 2015-12-18: qty 2

## 2015-12-18 MED ORDER — HYDROCODONE-ACETAMINOPHEN 5-325 MG PO TABS
1.0000 | ORAL_TABLET | Freq: Four times a day (QID) | ORAL | Status: DC | PRN
  Administered 2015-12-19: 1 via ORAL
  Filled 2015-12-18: qty 1

## 2015-12-18 MED ORDER — SODIUM CHLORIDE 0.9 % IV SOLN: INTRAVENOUS | Status: DC

## 2015-12-18 MED ORDER — DEXTROSE-NACL 5-0.45 % IV SOLN
INTRAVENOUS | Status: DC
  Administered 2015-12-18 – 2015-12-19 (×2): via INTRAVENOUS

## 2015-12-18 MED ORDER — IOPAMIDOL (ISOVUE-300) INJECTION 61%
100.0000 mL | Freq: Once | INTRAVENOUS | Status: AC | PRN
  Administered 2015-12-18: 100 mL via INTRAVENOUS

## 2015-12-18 MED ORDER — SODIUM CHLORIDE 0.9 % IV BOLUS (SEPSIS)
1000.0000 mL | Freq: Once | INTRAVENOUS | Status: AC
  Administered 2015-12-18: 1000 mL via INTRAVENOUS

## 2015-12-18 MED ORDER — ACETAMINOPHEN 325 MG PO TABS: 650.0000 mg | ORAL_TABLET | Freq: Four times a day (QID) | ORAL | Status: DC | PRN

## 2015-12-18 MED ORDER — ALPRAZOLAM 0.25 MG PO TABS: 0.2500 mg | ORAL_TABLET | Freq: Three times a day (TID) | ORAL | Status: DC | PRN

## 2015-12-18 MED ORDER — SODIUM CHLORIDE 0.9 % IV SOLN: Freq: Once | INTRAVENOUS | Status: DC

## 2015-12-18 NOTE — ED Triage Notes (Signed)
Pt referred by PCP. Pt has been vomiting since 6 this am. C/o generalized body aches. Pt states "Just everything hurts." Pt is type 1 diabetic CBG prior to arrival was 300. Pt took phenergan about 10 this am with no relief.   CBG now 287

## 2015-12-18 NOTE — H&P (Signed)
History and Physical    Ineke Stufflebean [id number] DOB: 24-Oct-1965 DOA: 12/18/2015  PCP: Vikki Ports, MD   Patient coming from: Home  Chief Complaint: Nausea and vomiting  HPI: Chessica Gillean is a 50 y.o. woman with a history of Type 1 Diabetes (managed with an insulin pump/followed at [facility]) and hypercholesterolemia who presents with intractable nausea and vomiting for less than 24 hours prior to presentation.  She has had an intolerance to solids and liquids.  She denies fever, but she has had chills and sweats.  No known sick contacts.  No cough.  No dysuria.  No diarrhea, but she is having abdominal cramps.  She also reports that she just started her period today.    ED Course: Patient has evidence of DKA and dehydration/AKI with glucose 338, bicarb 8, potassium 5.9, BUN 34, Creatinine 1.39.  Venous pH 7.040.  WBC count 18 but no obvious source of infection.  U/A shows ketones, glucose, protein, and blood (but the blood is likely a result of contamination due active menstrual cycle).  She has received 2.5L NS via bolus.  Insulin drip per protocol has been initiated.  CT scan shows hepatic steatosis but no other acute findings that would explain her DKA.  Insulin pump disconnected.  Urine drug screen pending. Urine pregnancy test is negative.  Hospitalist asked to admit.  Review of Systems: As per HPI otherwise 10 point review of systems negative.    Past Medical History:  Diagnosis Date  . Diabetes mellitus type 1 (HCC) 2000   Dr. Roberto Scales [facility])  . Migraines    with pregnancy  . Pure hypercholesterolemia     History reviewed. No pertinent surgical history.  S/P 3 vaginal deliveries.   reports that she has never smoked. She has never used smokeless tobacco. She reports that she drinks alcohol. She reports that she does not use drugs.  Former "social" tobacco use in her 20's.  She has a glass of wine with dinner most nights.  History of recreational drug use  in her 36's.  Allergies  Allergen Reactions  . Penicillins     As a child  Has patient had a PCN reaction causing immediate rash, facial/tongue/throat swelling, SOB or lightheadedness with hypotension: unknown Has patient had a PCN reaction causing severe rash involving mucus membranes or skin necrosis: unknown Has patient had a PCN reaction that required hospitalization: unknown Has patient had a PCN reaction occurring within the last 10 years: no If all of the above answers are "NO", then may proceed with Cephalosporin use.    Family History  Problem Relation Age of Onset  . Cancer Father     lung cancer  . Hypertension Father   . Alcohol abuse Brother   . Cancer Maternal Grandmother     colon cancer in 74's  . Cancer Paternal Grandmother     breast cancer in her 41's  . Heart disease Paternal Grandmother   . Diabetes Neg Hx      Prior to Admission medications   Medication Sig Start Date End Date Taking? Authorizing Provider  ALPRAZolam Duanne Moron) 0.5 MG tablet Take 0.5-1 tablets (0.25-0.5 mg total) by mouth 3 (three) times daily as needed for anxiety (for flying). 08/13/14  Yes Rita Ohara, MD  canagliflozin (INVOKANA) 100 MG TABS tablet Take 100 mg by mouth daily with breakfast.  06/04/15  Yes Historical Provider, MD  ibuprofen (ADVIL,MOTRIN) 200 MG tablet Take 800 mg by mouth every 8 (eight) hours as needed  for headache or cramping (pain).   Yes Historical Provider, MD  insulin aspart (NOVOLOG) 100 UNIT/ML injection Inject into the skin 3 (three) times daily before meals.   Yes Historical Provider, MD  levothyroxine (SYNTHROID, LEVOTHROID) 50 MCG tablet Take 25 mcg by mouth daily before breakfast.    Yes Historical Provider, MD  Multiple Vitamins-Minerals (ALIVE WOMENS 50+ PO) Take 1 tablet by mouth daily.   Yes Historical Provider, MD  Multiple Vitamins-Minerals (MULTIVITAMIN WITH MINERALS) tablet Take 1 tablet by mouth daily.   Yes Historical Provider, MD  Probiotic Product  (PROBIOTIC DAILY PO) Take 1 tablet by mouth daily.   Yes Historical Provider, MD    Physical Exam: Vitals:   12/18/15 1632 12/18/15 1854 12/18/15 2115 12/18/15 2130  BP: 92/56 (!) 125/46 (!) 120/46 (!) 119/43  Pulse: 102 95 99 101  Resp: 20 16 21 23   Temp: 97.7 F (36.5 C)     TempSrc: Oral     SpO2: 100% 100% 100% 100%      Constitutional: NAD but ill appearing though she is able to get up in the middle of the history and physical and ambulated to the restroom.  She also now has a Coke Zero at bedside. Vitals:   12/18/15 1632 12/18/15 1854 12/18/15 2115 12/18/15 2130  BP: 92/56 (!) 125/46 (!) 120/46 (!) 119/43  Pulse: 102 95 99 101  Resp: 20 16 21 23   Temp: 97.7 F (36.5 C)     TempSrc: Oral     SpO2: 100% 100% 100% 100%   Eyes: PERRL, lids and conjunctivae normal ENMT: Mucous membranes are slightly dry. Posterior pharynx clear of any exudate or lesions. Normal dentition.  Neck: normal appearance, supple Respiratory: clear to auscultation bilaterally, no wheezing, no crackles. Normal respiratory effort. No accessory muscle use.  Cardiovascular: Mildly tachycardic but regular.  No murmurs / rubs / gallops. No extremity edema. 2+ pedal pulses.  GI: abdomen is soft and compressible.  No distention.  No guarding but she complains of diffuse pains.  No tenderness.  No masses palpated.  Bowel sounds are present. Musculoskeletal:  No joint deformity in upper and lower extremities. Good ROM, no contractures. Normal muscle tone.  Skin: no rashes, warm and dry Neurologic: CN 2-12 grossly intact. Sensation intact, Strength symmetric bilaterally, 5/5  Psychiatric: Normal judgment and insight. Alert and oriented x 3. Normal mood.     Labs on Admission: I have personally reviewed following labs and imaging studies  CBC:  Recent Labs Lab 12/18/15 1718  WBC 18.1*  HGB 15.9*  HCT 48.9*  MCV 93.3  PLT 123456   Basic Metabolic Panel:  Recent Labs Lab 12/18/15 1802  NA 138  K  5.9*  CL 107  CO2 8*  GLUCOSE 338*  BUN 34*  CREATININE 1.39*  CALCIUM 9.3   GFR: CrCl cannot be calculated (Unknown ideal weight.). Liver Function Tests:  Recent Labs Lab 12/18/15 1802  AST 30  ALT 27  ALKPHOS 72  BILITOT 1.3*  PROT 8.2*  ALBUMIN 4.9    Recent Labs Lab 12/18/15 1802  LIPASE 12   CBG:  Recent Labs Lab 12/18/15 1635 12/18/15 1947 12/18/15 2113  GLUCAP 287* 261* 249*   Urine analysis:    Component Value Date/Time   COLORURINE YELLOW 12/18/2015 1700   APPEARANCEUR CLOUDY (A) 12/18/2015 1700   LABSPEC 1.027 12/18/2015 1700   PHURINE 5.5 12/18/2015 1700   GLUCOSEU >1000 (A) 12/18/2015 1700   HGBUR LARGE (A) 12/18/2015 1700   BILIRUBINUR  NEGATIVE 12/18/2015 1700   BILIRUBINUR neg 09/25/2014 1406   KETONESUR >80 (A) 12/18/2015 1700   PROTEINUR 30 (A) 12/18/2015 1700   UROBILINOGEN negative 09/25/2014 1406   NITRITE NEGATIVE 12/18/2015 1700   LEUKOCYTESUR NEGATIVE 12/18/2015 1700   Sepsis Labs:  Lactic acid level 3.94  Radiological Exams on Admission: Ct Abdomen Pelvis W Contrast  Result Date: 12/18/2015 CLINICAL DATA:  Initial evaluation for acute abdominal pain, nausea, vomiting. EXAM: CT ABDOMEN AND PELVIS WITH CONTRAST TECHNIQUE: Multidetector CT imaging of the abdomen and pelvis was performed using the standard protocol following bolus administration of intravenous contrast. CONTRAST:  175mL ISOVUE-300 IOPAMIDOL (ISOVUE-300) INJECTION 61% COMPARISON:  None. FINDINGS: Lower chest: Mild left basilar atelectasis. Visualized lung bases are otherwise clear. Hepatobiliary: Diffuse hypoattenuation liver consistent with steatosis. Liver otherwise unremarkable. Gallbladder within normal limits. No biliary dilatation. Pancreas: Pancreas within normal limits. Spleen: Spleen within normal limits. Adrenals/Urinary Tract: Adrenal glands are normal. Kidneys are equal in size with symmetric enhancement. No nephrolithiasis, hydronephrosis, or focal enhancing  renal mass. No acute abnormality seen along the course of either ureter. Bladder well distended and within normal limits. Stomach/Bowel: Stomach mildly distended with intraluminal fluid density within the gastric lumen. Mildly prominent mucosal enhancement within the stomach, likely related to timing of the contrast bolus. Known gastric wall thickening to suggest acute gastritis. Small bowel of normal caliber without evidence for obstruction or acute inflammatory changes. Appendix not discretely identified, however, no inflammatory changes are seen within the right lower quadrant about the cecum to suggest acute appendicitis. Colon within normal limits without acute inflammatory changes. Vascular/Lymphatic: Normal intravascular enhancement seen throughout the intra-abdominal aorta and its branch vessels. Mild aorto bi-iliac atherosclerotic disease. No aneurysm. No pathologically enlarged intra-abdominal or pelvic lymph nodes identified. Reproductive: Uterus and ovaries within normal limits. Other: No free intraperitoneal air.  No free fluid. Musculoskeletal: No acute osseous abnormality. No worrisome lytic or blastic osseous lesions. IMPRESSION: 1. No CT evidence for acute intra-abdominal or pelvic process. 2. Hepatic steatosis. Electronically Signed   By: Jeannine Boga M.D.   On: 12/18/2015 21:04    EKG: Pending.  Assessment/Plan Principal Problem:   DKA, type 1 (HCC) Active Problems:   Lactic acidosis   Leukocytosis   Hyperkalemia   AKI (acute kidney injury) ([place])   DKA (diabetic ketoacidoses) ([place])      DKA, trigger unclear.  Patient complains of chills and sweats but no definitive source of infection identified at this time. --IV insulin per protocol, patient has detached her own insulin pump. --IV fluids per protocol --NPO except Ice chips/sips with meds for now --BMP q4h --Follow lactic acid level  --Blood cultures and chest xray for fever greater than 100.4 --UDS added on to  initial urine collection --Check A1c  AKI secondary to dehydration due to N/V --Expect improvement with IV fluid resusciation --Follow BUN/Cr trend  Abdominal pain, she thinks it "may just be cramps" related to her menstrual cycle.  She does not have a surgical abdomen. --Avoid NSAIDS for now --Options for alternative analgesics given  Hypothyroidism --Continue home dose of levothyroxine  Anxiety --Xanax prn   DVT prophylaxis: SCDs Code Status: FULL Family Communication: Significant other present in the ED at the time of admission. Disposition Plan: Expect she will go home at discharge. Consults called: NONE Admission status: Inpatient, stepdown unit with telemetry monitoring.  Based on the patient's severity of illness and the degree of her acidosis, I would not expect her DKA to be corrected in 24 hours.  Thus, I expect she will need inpatient therapy for at least two midnights.   TIME SPENT: 70 minutes   Eber Jones MD [facility] Pager [phone]  If 7PM-7AM, please contact night-coverage [email or web address] Password [facility]  12/18/2015, 9:46 PM

## 2015-12-18 NOTE — ED Notes (Signed)
Patient transported to CT 

## 2015-12-18 NOTE — ED Notes (Signed)
Pt.'s cbg checked with result of 261mg /dl.,  Pt.'s rechecked her cbg using her personal cbg machine with result of 300mg /dl, pt. Insisted of giving herself  A 2.9units of novolog per insulin pump. Pt. Refused for insulin drip . This Nurse attempted to educate pt. Of following ED Doc's orders when it come to her medicines while still here in ED. To notify MD. Pt.'s insulin pump is still  "ON".

## 2015-12-18 NOTE — ED Provider Notes (Signed)
Pt is a 50 y.o. female who presents with  Chief Complaint  Patient presents with  . Emesis  Patient presented to the emergency room with complaints of nausea vomiting and elevated blood sugars.  Physical Exam  Constitutional: No distress.  Listless  HENT:  Head: Normocephalic and atraumatic.  Eyes: Conjunctivae are normal. Left eye exhibits no discharge. No scleral icterus.  Neck: No tracheal deviation present. No thyromegaly present.  Pulmonary/Chest: Effort normal and breath sounds normal. No stridor.  Abdominal: She exhibits no distension.  Musculoskeletal: She exhibits no tenderness or deformity.  Neurological: She is alert.  Skin: Skin is warm. No rash noted. She is not diaphoretic. No erythema.  Psychiatric: Affect normal.    Clinical Course    Patient's laboratory tests are suggestive of diabetic ketoacidosis. She has a low venous pH, decreased bicarbonate levels and elevated blood sugars. Patient was treated with IV insulin and fluids. Initial potassium was elevated so no supplemental potassium initially.  Continue to monitor her electrolyte panel and serial CBG. . Admit to the hospital for further treatment.  Diabetic ketoacidosis without coma associated with type 1 diabetes mellitus [facility])   Medical screening examination/treatment/procedure(s) were conducted as a shared visit with non-physician practitioner(s) and myself.  I personally evaluated the patient during the encounter.       Dorie Rank, MD 12/18/15 2055

## 2015-12-18 NOTE — Progress Notes (Signed)
   Subjective:    Patient ID: Briana Lopez, female    DOB: 18-Feb-1965, 50 y.o.   MRN: [id number]  HPI Chief Complaint  Patient presents with  . sick-    throwing up every 30 mins since 6am this morning. cold chills, headache, hold body hurts   She is A 50 year old female with a history of type 1 diabetes who presents with complaints of nausea and vomiting since 6:30 am today. States she had some nausea last night but after eating dinner she felt fine. Woke up vomiting and reports every 30 minutes she is vomiting. She also reports feeling very weak, dizzy with standing, headache, bodyaches and chills. Cannot keep ice chips or any fluids down. She tried to use a Phenergan suppository and states she had a bowel movement and was unable to keep it in. Denies having diarrhea.  Diabetes is normally well controlled with readings in the low 100s. She checked her blood sugar here in the office and it is 307 at present.   She denies history of DKA or hospitalization for diabetes.   Last menstrual period: Today     Review of Systems Pertinent positives and negatives in the history of present illness.     Objective:   Physical Exam  Constitutional: She is oriented to person, place, and time. She appears well-developed. She has a sickly appearance. She appears distressed.  HENT:  Mouth/Throat: Mucous membranes are pale and dry.  Cardiovascular: Normal rate, regular rhythm and normal pulses.   Pulmonary/Chest: Effort normal and breath sounds normal.  Abdominal: Soft. Normal appearance. Bowel sounds are decreased. There is no hepatosplenomegaly. There is tenderness. There is no rigidity, no rebound, no guarding, no tenderness at McBurney's point and negative Murphy's sign.  Diffusely tender to palpation throughout.   Neurological: She is alert and oriented to person, place, and time.  Skin: Skin is warm and dry. There is pallor.  Psychiatric: She has a normal mood and affect. Her speech is  normal and behavior is normal. Thought content normal.   BP 110/70 (BP Location: Right Arm, Patient Position: Standing, Cuff Size: Normal)   Pulse (!) 109   Temp 97.4 F (36.3 C) (Oral)   SpO2 98%    Glucose 307     Assessment & Plan:  Intractable vomiting with nausea, unspecified vomiting type  Type 1 diabetes mellitus without complication (HCC)  Discussed that she is orthostatic, appears dehydrated and since she does have type 1 diabetes that is normally well controlled I am sending her to the emergency department for IV hydration and further evaluation. Called and spoke with triage nurse Katie and made them aware of her arrival.

## 2015-12-18 NOTE — ED Provider Notes (Signed)
[facility] DEPT Provider Note   CSN: [id number] Arrival date & time: 12/18/15  1616     History   Chief Complaint Chief Complaint  Patient presents with  . Emesis    HPI Briana Lopez is a 50 y.o. female with pertinent pmh of T1DM presents to ED with vomiting every 30 mins and diffuse abdominal pain since 0630 today.  Pt reports chills, weakness, body aches and light headedness.  She has been unable to tolerate fluids/solids PO since this morning.  Pt has prescription of phenergan suppository which she takes occasional for nausea, she tried taking 1 dose this morning but she had a BM and was not able to keep it in.  Pt went to PCP today who referred her to ED for dehydration and further eval. Pt denies fevers, sick contacts with similar contacts, headache, back pain.  She went out to a restaurant last night and is unsure if this is due to from the food she ate.  She reports dysuria and vaginal itching x 9 days ago.  She is currently in her menstrual cycle.  Pt denies DKA episodes in the past.   HPI  Past Medical History:  Diagnosis Date  . Diabetes mellitus type 1 (HCC) 2000   Dr. Roberto Scales [facility])  . Migraines    with pregnancy  . Pure hypercholesterolemia     Patient Active Problem List   Diagnosis Date Noted  . DKA, type 1 ([place]) 12/18/2015  . Travel advice encounter 05/16/2014  . Elevated TSH 06/05/2012  . Type 1 diabetes mellitus ([place]) 08/04/2011  . Pure hypercholesterolemia 08/04/2011  . Diabetes ([place]) 10/09/1998    History reviewed. No pertinent surgical history.  OB History    Gravida Para Term Preterm AB Living   6       3 3    SAB TAB Ectopic Multiple Live Births   1 2             Home Medications    Prior to Admission medications   Medication Sig Start Date End Date Taking? Authorizing Provider  ALPRAZolam Duanne Moron) 0.5 MG tablet Take 0.5-1 tablets (0.25-0.5 mg total) by mouth 3 (three) times daily as needed for anxiety (for flying). 08/13/14  Yes  Rita Ohara, MD  canagliflozin [facility]) 100 MG TABS tablet Take 100 mg by mouth daily with breakfast.  06/04/15  Yes Historical Provider, MD  ibuprofen (ADVIL,MOTRIN) 200 MG tablet Take 800 mg by mouth every 8 (eight) hours as needed for headache or cramping (pain).   Yes Historical Provider, MD  insulin aspart (NOVOLOG) 100 UNIT/ML injection Inject into the skin 3 (three) times daily before meals.   Yes Historical Provider, MD  levothyroxine (SYNTHROID, LEVOTHROID) 50 MCG tablet Take 25 mcg by mouth daily before breakfast.    Yes Historical Provider, MD  Multiple Vitamins-Minerals (ALIVE WOMENS 50+ PO) Take 1 tablet by mouth daily.   Yes Historical Provider, MD  Multiple Vitamins-Minerals (MULTIVITAMIN WITH MINERALS) tablet Take 1 tablet by mouth daily.   Yes Historical Provider, MD  Probiotic Product (PROBIOTIC DAILY PO) Take 1 tablet by mouth daily.   Yes Historical Provider, MD    Family History Family History  Problem Relation Age of Onset  . Cancer Father     lung cancer  . Hypertension Father   . Alcohol abuse Brother   . Cancer Maternal Grandmother     colon cancer in 43's  . Cancer Paternal Grandmother     breast cancer in her 77's  .  Heart disease Paternal Grandmother   . Diabetes Neg Hx     Social History Social History  Substance Use Topics  . Smoking status: Never Smoker  . Smokeless tobacco: Never Used  . Alcohol use 0.0 oz/week     Comment: 1 glass of wine each evening with dinner, 2nd glass on weekends (2-3 days/week)     Allergies   Penicillins   Review of Systems Review of Systems  Constitutional: Positive for appetite change (decreased) and chills. Negative for fever.  HENT: Negative for congestion and sneezing.   Eyes: Negative for visual disturbance.  Respiratory: Negative for chest tightness and shortness of breath.   Cardiovascular: Negative for chest pain and leg swelling.  Gastrointestinal: Positive for abdominal pain, nausea and vomiting.  Negative for constipation and diarrhea.  Genitourinary: Positive for pelvic pain (pt reports she in her menstrual cycle and has pelvic pain every month with cycle). Negative for dysuria and flank pain.  Musculoskeletal: Negative for back pain and joint swelling.  Skin: Negative for color change.  Neurological: Positive for light-headedness. Negative for weakness and headaches.  Psychiatric/Behavioral: Negative.      Physical Exam Updated Vital Signs BP (!) 120/46   Pulse 99   Temp 97.7 F (36.5 C) (Oral)   Resp 21   SpO2 100%   Physical Exam  Constitutional: She is oriented to person, place, and time. She appears well-developed and well-nourished. No distress.  HENT:  Head: Normocephalic and atraumatic.  Mucous membranes dry.  Eyes: Conjunctivae are normal. Pupils are equal, round, and reactive to light.  Neck: Neck supple.  Cardiovascular: Regular rhythm and normal heart sounds.   No murmur heard. Tachycardic  Pulmonary/Chest: Effort normal and breath sounds normal. No respiratory distress.  Abdominal: Soft. Bowel sounds are normal. She exhibits no distension and no mass. There is tenderness (diffuse ). There is no rebound and no guarding.  Lymphadenopathy:    She has no cervical adenopathy.  Neurological: She is alert and oriented to person, place, and time.  Skin: Skin is warm and dry.  Psychiatric: She has a normal mood and affect.     ED Treatments / Results  Labs (all labs ordered are listed, but only abnormal results are displayed) Labs Reviewed  CBC - Abnormal; Notable for the following:       Result Value   WBC 18.1 (*)    RBC 5.24 (*)    Hemoglobin 15.9 (*)    HCT 48.9 (*)    All other components within normal limits  URINALYSIS, ROUTINE W REFLEX MICROSCOPIC (NOT AT [facility]) - Abnormal; Notable for the following:    APPearance CLOUDY (*)    Glucose, UA >1000 (*)    Hgb urine dipstick LARGE (*)    Ketones, ur >80 (*)    Protein, ur 30 (*)    All other  components within normal limits  URINE MICROSCOPIC-ADD ON - Abnormal; Notable for the following:    Squamous Epithelial / LPF 0-5 (*)    Bacteria, UA RARE (*)    All other components within normal limits  COMPREHENSIVE METABOLIC PANEL - Abnormal; Notable for the following:    Potassium 5.9 (*)    CO2 8 (*)    Glucose, Bld 338 (*)    BUN 34 (*)    Creatinine, Ser 1.39 (*)    Total Protein 8.2 (*)    Total Bilirubin 1.3 (*)    GFR calc non Af Amer 44 (*)    GFR calc Af  Amer 51 (*)    Anion gap 23 (*)    All other components within normal limits  BLOOD GAS, VENOUS - Abnormal; Notable for the following:    pH, Ven 7.040 (*)    pCO2, Ven 21.5 (*)    Bicarbonate 5.5 (*)    Acid-base deficit 25.1 (*)    All other components within normal limits  CBG MONITORING, ED - Abnormal; Notable for the following:    Glucose-Capillary 287 (*)    All other components within normal limits  CBG MONITORING, ED - Abnormal; Notable for the following:    Glucose-Capillary 261 (*)    All other components within normal limits  I-STAT CG4 LACTIC ACID, ED - Abnormal; Notable for the following:    Lactic Acid, Venous 3.94 (*)    All other components within normal limits  CBG MONITORING, ED - Abnormal; Notable for the following:    Glucose-Capillary 249 (*)    All other components within normal limits  LIPASE, BLOOD  I-STAT BETA HCG BLOOD, ED ([facility], [facility], [facility] ONLY)  I-STAT VENOUS BLOOD GAS, ED  I-STAT CG4 LACTIC ACID, ED    EKG  EKG Interpretation None       Radiology Ct Abdomen Pelvis W Contrast  Result Date: 12/18/2015 CLINICAL DATA:  Initial evaluation for acute abdominal pain, nausea, vomiting. EXAM: CT ABDOMEN AND PELVIS WITH CONTRAST TECHNIQUE: Multidetector CT imaging of the abdomen and pelvis was performed using the standard protocol following bolus administration of intravenous contrast. CONTRAST:  174mL ISOVUE-300 IOPAMIDOL (ISOVUE-300) INJECTION 61% COMPARISON:  None. FINDINGS: Lower chest:  Mild left basilar atelectasis. Visualized lung bases are otherwise clear. Hepatobiliary: Diffuse hypoattenuation liver consistent with steatosis. Liver otherwise unremarkable. Gallbladder within normal limits. No biliary dilatation. Pancreas: Pancreas within normal limits. Spleen: Spleen within normal limits. Adrenals/Urinary Tract: Adrenal glands are normal. Kidneys are equal in size with symmetric enhancement. No nephrolithiasis, hydronephrosis, or focal enhancing renal mass. No acute abnormality seen along the course of either ureter. Bladder well distended and within normal limits. Stomach/Bowel: Stomach mildly distended with intraluminal fluid density within the gastric lumen. Mildly prominent mucosal enhancement within the stomach, likely related to timing of the contrast bolus. Known gastric wall thickening to suggest acute gastritis. Small bowel of normal caliber without evidence for obstruction or acute inflammatory changes. Appendix not discretely identified, however, no inflammatory changes are seen within the right lower quadrant about the cecum to suggest acute appendicitis. Colon within normal limits without acute inflammatory changes. Vascular/Lymphatic: Normal intravascular enhancement seen throughout the intra-abdominal aorta and its branch vessels. Mild aorto bi-iliac atherosclerotic disease. No aneurysm. No pathologically enlarged intra-abdominal or pelvic lymph nodes identified. Reproductive: Uterus and ovaries within normal limits. Other: No free intraperitoneal air.  No free fluid. Musculoskeletal: No acute osseous abnormality. No worrisome lytic or blastic osseous lesions. IMPRESSION: 1. No CT evidence for acute intra-abdominal or pelvic process. 2. Hepatic steatosis. Electronically Signed   By: Jeannine Boga M.D.   On: 12/18/2015 21:04    Procedures Procedures (including critical care time) CRITICAL CARE Performed by: Kinnie Feil   Total critical care time: 40  minutes  Critical care time was exclusive of separately billable procedures and treating other patients.  Critical care was necessary to treat or prevent imminent or life-threatening deterioration.  Critical care was time spent personally by me on the following activities: development of treatment plan with patient and/or surrogate as well as nursing, discussions with consultants, evaluation of patient's response to treatment, examination of patient,  obtaining history from patient or surrogate, ordering and performing treatments and interventions, ordering and review of laboratory studies, ordering and review of radiographic studies, pulse oximetry and re-evaluation of patient's condition.   Medications Ordered in ED Medications  0.9 %  sodium chloride infusion (not administered)  dextrose 5 %-0.45 % sodium chloride infusion (not administered)  insulin regular (NOVOLIN R,HUMULIN R) 250 Units in sodium chloride 0.9 % 250 mL (1 Units/mL) infusion (not administered)  sodium chloride 0.9 % bolus 1,000 mL (1,000 mLs Intravenous New Bag/Given 12/18/15 2000)    And  0.9 %  sodium chloride infusion (not administered)  ondansetron (ZOFRAN-ODT) disintegrating tablet 4 mg (4 mg Oral Given 12/18/15 1639)  sodium chloride 0.9 % bolus 1,500 mL (0 mLs Intravenous Stopped 12/18/15 2002)  ondansetron (ZOFRAN) injection 4 mg (4 mg Intravenous Given 12/18/15 1803)  iopamidol (ISOVUE-300) 61 % injection 100 mL (100 mLs Intravenous Contrast Given 12/18/15 2017)     Initial Impression / Assessment and Plan / ED Course  I have reviewed the triage vital signs and the nursing notes.  Pertinent labs & imaging results that were available during my care of the patient were reviewed by me and considered in my medical decision making (see chart for details).  Clinical Course as of Dec 18 2126  Ludwig Clarks Dec 18, 2015  2000 Checked in with pt.  RN and pt's partner in room.  Pt had just administered 2.9 U from her insulin  pump, right before RN tried to start glucose stabilizer.  Instructed pt to d/c her pump so we can start glucose stabilizer.  Pt agreeable.  RN will recheck cbg at 2100 before starting glucose stabilizer.   [CG]  2121 Pt reports improved abdominal pain and nausea.  Pt and pt's partner agreeable to admission and treatment plan.  Pt would like hospitalist to touch base with Dr. Jacinto Reap  [CG]    Clinical Course User Index [CG] Kinnie Feil, PA-C   50 yo female with pertinent pmh of T1DM with insulin pump presents with nausea, vomiting, abdominal pain, light headedness since this morning.  Pt sent to ED by PCP for nausea, vomiting and signs of dehydration/orthostatic.  On exam pt is afebrile, tachycardic with SBP 92.  Lungs are clear. Abdomen in soft and mildly, diffusely tender. No CVA tenderness, no suprapubic tenderness.  Insulin pump in place without surrounding erythema, edema or fluctuance.  No obvious signs of infection on exam.  CMP suggestive of DKA with bicarb 8, anion gab 23 with initial CBG 287.  Urine with ketones and no bacteria.  Elevated WBC at 18, lactic acid 3.94 and creatinine 1.39 likely due to dehydration.  Pt received zofran for nausea.  She was given 2.5 NS bolus (1.5L, 1l) in ED.  Pt self administered 2.9 U of insulin from her pump at 2000/same time RN was trying to to initiate Glucose stabilizer.  Pt agreeable to d/c her insulin pump after this last infusion.  RN to recheck CBG within an hour of 2.9U of insulin admin to reassess and start glucose stabilizer.  Pt reports improvement in nausea and abdominal pain.  Plan is to f/u CBG by RN and initiate glucose stabilizer, control nausea, and admit to SDU.  Pending CT abdomen/pelvis.  Discussed pt with Dr. Jeneen Rinks and Domenic Moras, PA-C who assisted in MDM and admission to SDU.   Final Clinical Impressions(s) / ED Diagnoses   Final diagnoses:  Diabetic ketoacidosis without coma associated with type 1 diabetes mellitus ([place])  New  Prescriptions New Prescriptions   No medications on file     Arlean Hopping 12/18/15 2132    Tanna Furry, MD 01/19/16 [phone]

## 2015-12-18 NOTE — ED Notes (Signed)
PA at bedside.

## 2015-12-19 DIAGNOSIS — N179 Acute kidney failure, unspecified: Secondary | ICD-10-CM

## 2015-12-19 DIAGNOSIS — E875 Hyperkalemia: Secondary | ICD-10-CM

## 2015-12-19 DIAGNOSIS — E1010 Type 1 diabetes mellitus with ketoacidosis without coma: Principal | ICD-10-CM

## 2015-12-19 DIAGNOSIS — D72829 Elevated white blood cell count, unspecified: Secondary | ICD-10-CM

## 2015-12-19 DIAGNOSIS — E872 Acidosis: Secondary | ICD-10-CM

## 2015-12-19 LAB — CBC WITH DIFFERENTIAL/PLATELET
BASOS PCT: 0 %
Basophils Absolute: 0 10*3/uL (ref 0.0–0.1)
EOS ABS: 0 10*3/uL (ref 0.0–0.7)
EOS PCT: 0 %
HCT: 37.3 % (ref 36.0–46.0)
Hemoglobin: 12.2 g/dL (ref 12.0–15.0)
LYMPHS ABS: 1.2 10*3/uL (ref 0.7–4.0)
Lymphocytes Relative: 8 %
MCH: 30 pg (ref 26.0–34.0)
MCHC: 32.7 g/dL (ref 30.0–36.0)
MCV: 91.6 fL (ref 78.0–100.0)
MONOS PCT: 8 %
Monocytes Absolute: 1.2 10*3/uL — ABNORMAL HIGH (ref 0.1–1.0)
NEUTROS PCT: 84 %
Neutro Abs: 12.2 10*3/uL — ABNORMAL HIGH (ref 1.7–7.7)
PLATELETS: 319 10*3/uL (ref 150–400)
RBC: 4.07 MIL/uL (ref 3.87–5.11)
RDW: 15 % (ref 11.5–15.5)
WBC: 14.6 10*3/uL — AB (ref 4.0–10.5)

## 2015-12-19 LAB — BASIC METABOLIC PANEL
ANION GAP: 5 (ref 5–15)
ANION GAP: 6 (ref 5–15)
Anion gap: 9 (ref 5–15)
Anion gap: 5 (ref 5–15)
BUN: 22 mg/dL — ABNORMAL HIGH (ref 6–20)
BUN: 21 mg/dL — ABNORMAL HIGH (ref 6–20)
BUN: 24 mg/dL — AB (ref 6–20)
BUN: 25 mg/dL — AB (ref 6–20)
BUN: 28 mg/dL — AB (ref 6–20)
CALCIUM: 8 mg/dL — AB (ref 8.9–10.3)
CALCIUM: 8.2 mg/dL — AB (ref 8.9–10.3)
CALCIUM: 8.1 mg/dL — AB (ref 8.9–10.3)
CALCIUM: 8.2 mg/dL — AB (ref 8.9–10.3)
CO2: 11 mmol/L — ABNORMAL LOW (ref 22–32)
CO2: 15 mmol/L — ABNORMAL LOW (ref 22–32)
CO2: 16 mmol/L — ABNORMAL LOW (ref 22–32)
CO2: 16 mmol/L — ABNORMAL LOW (ref 22–32)
CO2: <7 mmol/L — ABNORMAL LOW (ref 22–32)
CREATININE: 1.08 mg/dL — AB (ref 0.44–1.00)
CREATININE: 1.19 mg/dL — AB (ref 0.44–1.00)
CREATININE: 1.29 mg/dL — AB (ref 0.44–1.00)
Calcium: 8.1 mg/dL — ABNORMAL LOW (ref 8.9–10.3)
Chloride: 114 mmol/L — ABNORMAL HIGH (ref 101–111)
Chloride: 115 mmol/L — ABNORMAL HIGH (ref 101–111)
Chloride: 115 mmol/L — ABNORMAL HIGH (ref 101–111)
Chloride: 117 mmol/L — ABNORMAL HIGH (ref 101–111)
Chloride: 119 mmol/L — ABNORMAL HIGH (ref 101–111)
Creatinine, Ser: 0.99 mg/dL (ref 0.44–1.00)
Creatinine, Ser: 0.95 mg/dL (ref 0.44–1.00)
GFR calc Af Amer: 55 mL/min — ABNORMAL LOW (ref >60)
GFR calc Af Amer: >60 mL/min (ref >60)
GFR calc Af Amer: >60 mL/min (ref >60)
GFR calc Af Amer: >60 mL/min (ref >60)
GFR, EST NON AFRICAN AMERICAN: 48 mL/min — AB (ref >60)
GFR, EST NON AFRICAN AMERICAN: 53 mL/min — AB (ref >60)
GFR, EST NON AFRICAN AMERICAN: 59 mL/min — AB (ref >60)
GLUCOSE: 129 mg/dL — AB (ref 65–99)
GLUCOSE: 183 mg/dL — AB (ref 65–99)
GLUCOSE: 276 mg/dL — AB (ref 65–99)
Glucose, Bld: 138 mg/dL — ABNORMAL HIGH (ref 65–99)
Glucose, Bld: 169 mg/dL — ABNORMAL HIGH (ref 65–99)
POTASSIUM: 4.1 mmol/L (ref 3.5–5.1)
POTASSIUM: 5.1 mmol/L (ref 3.5–5.1)
Potassium: 4.3 mmol/L (ref 3.5–5.1)
Potassium: 4.1 mmol/L (ref 3.5–5.1)
Potassium: 3.8 mmol/L (ref 3.5–5.1)
SODIUM: 137 mmol/L (ref 135–145)
SODIUM: 139 mmol/L (ref 135–145)
SODIUM: 139 mmol/L (ref 135–145)
Sodium: 136 mmol/L (ref 135–145)
Sodium: 136 mmol/L (ref 135–145)

## 2015-12-19 LAB — LACTIC ACID, PLASMA
LACTIC ACID, VENOUS: 1.9 mmol/L (ref 0.5–1.9)
Lactic Acid, Venous: 0.8 mmol/L (ref 0.5–1.9)

## 2015-12-19 LAB — RAPID URINE DRUG SCREEN, HOSP PERFORMED
AMPHETAMINES: NOT DETECTED
Barbiturates: NOT DETECTED
Benzodiazepines: NOT DETECTED
Cocaine: NOT DETECTED
Opiates: NOT DETECTED
TETRAHYDROCANNABINOL: NOT DETECTED

## 2015-12-19 LAB — GLUCOSE, CAPILLARY
GLUCOSE-CAPILLARY: 243 mg/dL — AB (ref 65–99)
GLUCOSE-CAPILLARY: 192 mg/dL — AB (ref 65–99)
GLUCOSE-CAPILLARY: 154 mg/dL — AB (ref 65–99)
GLUCOSE-CAPILLARY: 141 mg/dL — AB (ref 65–99)
GLUCOSE-CAPILLARY: 133 mg/dL — AB (ref 65–99)
GLUCOSE-CAPILLARY: 108 mg/dL — AB (ref 65–99)
GLUCOSE-CAPILLARY: 143 mg/dL — AB (ref 65–99)
GLUCOSE-CAPILLARY: 158 mg/dL — AB (ref 65–99)
GLUCOSE-CAPILLARY: 176 mg/dL — AB (ref 65–99)
Glucose-Capillary: 107 mg/dL — ABNORMAL HIGH (ref 65–99)
Glucose-Capillary: 113 mg/dL — ABNORMAL HIGH (ref 65–99)
Glucose-Capillary: 140 mg/dL — ABNORMAL HIGH (ref 65–99)
Glucose-Capillary: 162 mg/dL — ABNORMAL HIGH (ref 65–99)
Glucose-Capillary: 162 mg/dL — ABNORMAL HIGH (ref 65–99)
Glucose-Capillary: 250 mg/dL — ABNORMAL HIGH (ref 65–99)

## 2015-12-19 LAB — MRSA PCR SCREENING: MRSA by PCR: NEGATIVE

## 2015-12-19 MED ORDER — INSULIN ASPART 100 UNIT/ML ~~LOC~~ SOLN
4.0000 [IU] | Freq: Three times a day (TID) | SUBCUTANEOUS | Status: DC
  Administered 2015-12-19: 4 [IU] via SUBCUTANEOUS

## 2015-12-19 NOTE — Discharge Instructions (Signed)
Blood Glucose Monitoring, Adult °Monitoring your blood glucose (also know as blood sugar) helps you to manage your diabetes. It also helps you and your health care provider monitor your diabetes and determine how well your treatment plan is working. °WHY SHOULD YOU MONITOR YOUR BLOOD GLUCOSE? °· It can help you understand how food, exercise, and medicine affect your blood glucose. °· It allows you to know what your blood glucose is at any given moment. You can quickly tell if you are having low blood glucose (hypoglycemia) or high blood glucose (hyperglycemia). °· It can help you and your health care provider know how to adjust your medicines. °· It can help you understand how to manage an illness or adjust medicine for exercise. °WHEN SHOULD YOU TEST? °Your health care provider will help you decide how often you should check your blood glucose. This may depend on the type of diabetes you have, your diabetes control, or the types of medicines you are taking. Be sure to write down all of your blood glucose readings so that this information can be reviewed with your health care provider. See below for examples of testing times that your health care provider may suggest. °Type 1 Diabetes °· Test at least 2 times per day if your diabetes is well controlled, if you are using an insulin pump, or if you perform multiple daily injections. °· If your diabetes is not well controlled or if you are sick, you may need to test more often. °· It is a good idea to also test: °¨ Before every insulin injection. °¨ Before and after exercise. °¨ Between meals and 2 hours after a meal. °¨ Occasionally between 2:00 a.m. and 3:00 a.m. °Type 2 Diabetes °· If you are taking insulin, test at least 2 times per day. However, it is best to test before every insulin injection. °· If you take medicines by mouth (orally), test 2 times a day. °· If you are on a controlled diet, test once a day. °· If your diabetes is not well controlled or if you  are sick, you may need to monitor more often. °HOW TO MONITOR YOUR BLOOD GLUCOSE °Supplies Needed °· Blood glucose meter. °· Test strips for your meter. Each meter has its own strips. You must use the strips that go with your own meter. °· A pricking needle (lancet). °· A device that holds the lancet (lancing device). °· A journal or log book to write down your results. °Procedure °· Wash your hands with soap and water. Alcohol is not preferred. °· Prick the side of your finger (not the tip) with the lancet. °· Gently milk the finger until a small drop of blood appears. °· Follow the instructions that come with your meter for inserting the test strip, applying blood to the strip, and using your blood glucose meter. °Other Areas to Get Blood for Testing °Some meters allow you to use other areas of your body (other than your finger) to test your blood. These areas are called alternative sites. The most common alternative sites are: °· The forearm. °· The thigh. °· The back area of the lower leg. °· The palm of the hand. °The blood flow in these areas is slower. Therefore, the blood glucose values you get may be delayed, and the numbers are different from what you would get from your fingers. Do not use alternative sites if you think you are having hypoglycemia. Your reading will not be accurate. Always use a finger if you are   having hypoglycemia. Also, if you cannot feel your lows (hypoglycemia unawareness), always use your fingers for your blood glucose checks. ADDITIONAL TIPS FOR GLUCOSE MONITORING  Do not reuse lancets.  Always carry your supplies with you.  All blood glucose meters have a 24-hour "hotline" number to call if you have questions or need help.  Adjust (calibrate) your blood glucose meter with a control solution after finishing a few boxes of strips. BLOOD GLUCOSE RECORD KEEPING It is a good idea to keep a daily record or log of your blood glucose readings. Most glucose meters, if not all,  keep your glucose records stored in the meter. Some meters come with the ability to download your records to your home computer. Keeping a record of your blood glucose readings is especially helpful if you are wanting to look for patterns. Make notes to go along with the blood glucose readings because you might forget what happened at that exact time. Keeping good records helps you and your health care provider to work together to achieve good diabetes management.    This information is not intended to replace advice given to you by your health care provider. Make sure you discuss any questions you have with your health care provider.   Document Released: 01/27/2003 Document Revised: 02/14/2014 Document Reviewed: 06/18/2012 Elsevier Interactive Patient Education 2016 [place].   Diabetes and Sick Day Management Blood sugar (glucose) can be more difficult to control when you are sick. Colds, fever, flu, nausea, vomiting, and diarrhea are all examples of common illnesses that can cause problems for people with diabetes. Loss of body fluids (dehydration) from fever, vomiting, diarrhea, infection, and the stress of a sickness can all cause blood glucose levels to increase. Because of this, it is very important to take your diabetes medicines and to eat some form of carbohydrate food when you are sick. Liquid or soft foods are often tolerated, and they help to replace fluids. HOME CARE INSTRUCTIONS These main guidelines are intended for managing a short-term (24 hours or less) sickness:  Take your usual dose of insulin or oral diabetes medicine. An exception would be if you take any form of metformin. If you cannot eat or drink, you can become dehydrated and should not take this medicine.  Continue to take your insulin even if you are unable to eat solid foods or are vomiting. Your insulin dose may stay the same, or it may need to be increased when you are sick.  You will need to test your blood  glucose more often, generally every 2-4 hours. If you have type 1 diabetes, test your urine for ketones every 4 hours. If you have type 2 diabetes, test your urine for ketones as directed by your health care provider.  Eat some form of food that contains carbohydrates. The carbohydrates can be in solid or liquid form. You should eat 45-50 g of carbohydrates every 3-4 hours.  Replace fluids if you have a fever, vomit, or have diarrhea. Ask your health care provider for specific rehydration instructions.  Watch carefully for the signs of ketoacidosis if you have type 1 diabetes. Call your health care provider if any of the following symptoms are present, especially in children:  Moderate to large ketones in the urine along with a high blood glucose level.  Severe nausea.  Vomiting.  Diarrhea.  Abdominal pain.  Rapid breathing.  Drink extra liquids that do not contain sugar such as water.  Be careful with over-the-counter medicines. Read the labels.  They may contain sugar or types of sugars that can increase your blood glucose level. Food Choices for Illness All of the food choices below contain about 15 g of carbohydrates. Plan ahead and keep some of these foods around.    to  cup carbonated beverage containing sugar. Carbonated beverages will usually be better tolerated if they are opened and left at room temperature for a few minutes.   of a twin frozen ice pop.   cup regular gelatin.   cup juice.   cup ice cream or frozen yogurt.   cup cooked cereal.   cup sherbet.  1 cup clear broth or soup.  1 cup cream soup.   cup regular custard.   cup regular pudding.  1 cup sports drink.  1 cup plain yogurt.  1 slice toast.  6 squares saltine crackers.  5 vanilla wafers. SEEK MEDICAL CARE IF:   You are unable to drink fluids, even small amounts.  You have nausea and vomiting for more than 6 hours.  You have diarrhea for more than 6 hours.  Your blood  glucose level is more than 240 mg/dL, even with additional insulin.  There is a change in mental status.  You develop an additional serious sickness.  You have been sick for 2 days and are not getting better.  You have a fever. SEEK IMMEDIATE MEDICAL CARE IF:  You have difficulty breathing.  You have moderate to large ketone levels. MAKE SURE YOU:  Understand these instructions.  Will watch your condition.  Will get help right away if you are not doing well or get worse.   This information is not intended to replace advice given to you by your health care provider. Make sure you discuss any questions you have with your health care provider.   Document Released: 01/27/2003 Document Revised: 02/14/2014 Document Reviewed: 07/03/2012 Elsevier Interactive Patient Education 2016 [employer].  Diabetes Mellitus and Food It is important for you to manage your blood sugar (glucose) level. Your blood glucose level can be greatly affected by what you eat. Eating healthier foods in the appropriate amounts throughout the day at about the same time each day will help you control your blood glucose level. It can also help slow or prevent worsening of your diabetes mellitus. Healthy eating may even help you improve the level of your blood pressure and reach or maintain a healthy weight.  General recommendations for healthful eating and cooking habits include:  Eating meals and snacks regularly. Avoid going long periods of time without eating to lose weight.  Eating a diet that consists mainly of plant-based foods, such as fruits, vegetables, nuts, legumes, and whole grains.  Using low-heat cooking methods, such as baking, instead of high-heat cooking methods, such as deep frying. Work with your dietitian to make sure you understand how to use the Nutrition Facts information on food labels. HOW CAN FOOD AFFECT ME? Carbohydrates Carbohydrates affect your blood glucose level more than any other  type of food. Your dietitian will help you determine how many carbohydrates to eat at each meal and teach you how to count carbohydrates. Counting carbohydrates is important to keep your blood glucose at a healthy level, especially if you are using insulin or taking certain medicines for diabetes mellitus. Alcohol Alcohol can cause sudden decreases in blood glucose (hypoglycemia), especially if you use insulin or take certain medicines for diabetes mellitus. Hypoglycemia can be a life-threatening condition. Symptoms of hypoglycemia (sleepiness, dizziness, and disorientation) are similar to symptoms  of having too much alcohol.  If your health care provider has given you approval to drink alcohol, do so in moderation and use the following guidelines:  Women should not have more than one drink per day, and men should not have more than two drinks per day. One drink is equal to:  12 oz of beer.  5 oz of wine.  1 oz of hard liquor.  Do not drink on an empty stomach.  Keep yourself hydrated. Have water, diet soda, or unsweetened iced tea.  Regular soda, juice, and other mixers might contain a lot of carbohydrates and should be counted. WHAT FOODS ARE NOT RECOMMENDED? As you make food choices, it is important to remember that all foods are not the same. Some foods have fewer nutrients per serving than other foods, even though they might have the same number of calories or carbohydrates. It is difficult to get your body what it needs when you eat foods with fewer nutrients. Examples of foods that you should avoid that are high in calories and carbohydrates but low in nutrients include:  Trans fats (most processed foods list trans fats on the Nutrition Facts label).  Regular soda.  Juice.  Candy.  Sweets, such as cake, pie, doughnuts, and cookies.  Fried foods. WHAT FOODS CAN I EAT? Eat nutrient-rich foods, which will nourish your body and keep you healthy. The food you should eat also will  depend on several factors, including:  The calories you need.  The medicines you take.  Your weight.  Your blood glucose level.  Your blood pressure level.  Your cholesterol level. You should eat a variety of foods, including:  Protein.  Lean cuts of meat.  Proteins low in saturated fats, such as fish, egg whites, and beans. Avoid processed meats.  Fruits and vegetables.  Fruits and vegetables that may help control blood glucose levels, such as apples, mangoes, and yams.  Dairy products.  Choose fat-free or low-fat dairy products, such as milk, yogurt, and cheese.  Grains, bread, pasta, and rice.  Choose whole grain products, such as multigrain bread, whole oats, and brown rice. These foods may help control blood pressure.  Fats.  Foods containing healthful fats, such as nuts, avocado, olive oil, canola oil, and fish. DOES EVERYONE WITH DIABETES MELLITUS HAVE THE SAME MEAL PLAN? Because every person with diabetes mellitus is different, there is not one meal plan that works for everyone. It is very important that you meet with a dietitian who will help you create a meal plan that is just right for you.   This information is not intended to replace advice given to you by your health care provider. Make sure you discuss any questions you have with your health care provider.   Document Released: 10/21/2004 Document Revised: 02/14/2014 Document Reviewed: 12/21/2012 Elsevier Interactive Patient Education 2016 [employer].  Diabetic Ketoacidosis Diabetic ketoacidosis is a life-threatening complication of diabetes. If it is not treated, it can cause severe dehydration and organ damage and can lead to a coma or death. CAUSES This condition develops when there is not enough of the hormone insulin in the body. Insulin helps the body to break down sugar for energy. Without insulin, the body cannot break down sugar, so it breaks down fats instead. This leads to the production of  acids that are called ketones. Ketones are poisonous at high levels. This condition can be triggered by:  Stress on the body that is brought on by an illness.  Medicines  that raise blood glucose levels.  Not taking diabetes medicine. SYMPTOMS Symptoms of this condition include:  Fatigue.  Weight loss.  Excessive thirst.  Light-headedness.  Fruity or sweet-smelling breath.  Excessive urination.  Vision changes.  Confusion or irritability.  Nausea.  Vomiting.  Rapid breathing.  Abdominal pain.  Feeling flushed. DIAGNOSIS This condition is diagnosed based on a medical history, a physical exam, and blood tests. You may also have a urine test that checks for ketones. TREATMENT This condition may be treated with:  Fluid replacement. This may be done to correct dehydration.  Insulin injections. These may be given through the skin or through an IV tube.  Electrolyte replacement. Electrolytes, such as potassium and sodium, may be given in pill form or through an IV tube.  Antibiotic medicines. These may be prescribed if your condition was caused by an infection. HOME CARE INSTRUCTIONS Eating and Drinking  Drink enough fluids to keep your urine clear or pale yellow.  If you cannot eat, alternate between drinking fluids with sugar (such as juice) and salty fluids (such as broth or bouillon).  If you can eat, follow your usual diet and drink sugar-free liquids, such as water. Other Instructions  Take insulin as directed by your health care provider. Do not skip insulin injections. Do not use expired insulin.  If your blood sugar is over 240 mg/dL, monitor your urine ketones every 4-6 hours.  If you were prescribed an antibiotic medicine, finish all of it even if you start to feel better.  Rest and exercise only as directed by your health care provider.  If you get sick, call your health care provider and begin treatment quickly. Your body often needs extra  insulin to fight an illness.  Check your blood glucose levels regularly. If your blood glucose is high, drink plenty of fluids. This helps to flush out ketones. SEEK MEDICAL CARE IF:  Your blood glucose level is too high or too low.  You have ketones in your urine.  You have a fever.  You cannot eat.  You cannot tolerate fluids.  You have been vomiting for more than 2 hours.  You continue to have symptoms of this condition.  You develop new symptoms. SEEK IMMEDIATE MEDICAL CARE IF:  Your blood glucose levels continue to be high (elevated).  Your monitor reads "high" even when you are taking insulin.  You faint.  You have chest pain.  You have trouble breathing.  You have a sudden, severe headache.  You have sudden weakness in one arm or one leg.  You have sudden trouble speaking or swallowing.  You have vomiting or diarrhea that gets worse after 3 hours.  You feel severely fatigued.  You have trouble thinking.  You have abdominal pain.  You are severely dehydrated. Symptoms of severe dehydration include:  Extreme thirst.  Dry mouth.  Blue lips.  Cold hands and feet.  Rapid breathing.   This information is not intended to replace advice given to you by your health care provider. Make sure you discuss any questions you have with your health care provider.   Document Released: 01/22/2000 Document Revised: 06/10/2014 Document Reviewed: 01/01/2014 Elsevier Interactive Patient Education 2016 [employer].  How to Avoid Diabetes Problems You can do a lot to prevent or slow down diabetes problems. Following your diabetes plan and taking care of yourself can reduce your risk of serious or life-threatening complications. Below, you will find certain things you can do to prevent diabetes problems. MANAGE YOUR  DIABETES Follow your health care provider's, nurse educator's, and dietitian's instructions for managing your diabetes. They will teach you the basics of  diabetes care. They can help answer questions you may have. Learn about diabetes and make healthy choices regarding eating and physical activity. Monitor your blood glucose level regularly. Your health care provider will help you decide how often to check your blood glucose level depending on your treatment goals and how well you are meeting them.  DO NOT USE NICOTINE Nicotine and diabetes are a dangerous combination. Nicotine raises your risk for diabetes problems. If you quit using nicotine, you will lower your risk for heart attack, stroke, nerve disease, and kidney disease. Your cholesterol and your blood pressure levels may improve. Your blood circulation will also improve. Do not use any tobacco products, including cigarettes, chewing tobacco, or electronic cigarettes. If you need help quitting, ask your health care provider. KEEP YOUR BLOOD PRESSURE UNDER CONTROL Your health care provider will determine your individualized target blood pressure based on your age, your medicines, how long you have had diabetes, and any other medical conditions you have. Blood pressure consists of two numbers. Generally, the goal is to keep your top number (systolic pressure) at or below 130, and your bottom number (diastolic pressure) at or below 80. Your health care provider may recommend a lower target blood pressure reading, if appropriate. Meal planning, medicines, and exercise can help you reach your target blood pressure. Make sure your health care provider checks your blood pressure at every visit. KEEP YOUR CHOLESTEROL UNDER CONTROL Normal cholesterol levels will help prevent heart disease and stroke. These are the biggest health problems for people with diabetes. Keeping cholesterol levels under control can also help with blood flow. Have your cholesterol level checked at least once a year. Your health care provider may prescribe a medicine known as a statin. Statins lower your cholesterol. If you are not taking  a statin, ask your health care provider if you should be. Meal planning, exercise, and medicines can help you reach your cholesterol targets.  SCHEDULE AND KEEP YOUR ANNUAL PHYSICAL EXAMS AND EYE EXAMS Your health care provider will tell you how often he or she wants to see you depending on your plan of treatment. It is important that you keep these appointments so that possible problems can be identified early and complications can be avoided or treated.  Every visit with your health care provider should include your weight, blood pressure, and an evaluation of your blood glucose control.  Your hemoglobin A1c should be checked:  At least twice a year if you are at your goal.  Every 3 months if there are changes in treatment.  If you are not meeting your goals.  Your blood lipids should be checked yearly. You should also be checked yearly to see if you have protein in your urine (microalbumin).  Schedule a dilated eye exam within 5 years of your diagnosis if you have type 1 diabetes, and then yearly. Schedule a dilated eye exam at diagnosis if you have type 2 diabetes, and then yearly. All exams thereafter can be extended to every 2 to 3 years if one or more exams have been normal. KEEP YOUR VACCINES CURRENT It is recommended that you receive a flu (influenza) vaccine every year. It is also recommended that you receive a pneumonia (pneumococcal) vaccine. If you are 61 years of age or older and have never received a pneumonia vaccine, this vaccine may be given as a series  of two separate shots. Ask your health care provider which additional vaccines may be recommended. TAKE CARE OF YOUR FEET  Diabetes may cause you to have a poor blood supply (circulation) to your legs and feet. Because of this, the skin may be thinner, break easier, and heal more slowly. You also may have nerve damage in your legs and feet, causing decreased feeling. You may not notice minor injuries to your feet that could lead  to serious problems or infections. Taking care of your feet is very important. Visual foot exams are performed at every routine medical visit. The exams check for cuts, injuries, or other problems with the feet. A comprehensive foot exam should be done yearly. This includes visual inspection as well as assessing foot pulses and testing for loss of sensation. You should also do the following:  Inspect your feet daily for cuts, calluses, blisters, ingrown toenails, and signs of infection, such as redness, swelling, or pus.  Wash and dry your feet thoroughly, especially between the toes.  Avoid soaking your feet regularly in hot water baths.  Moisturize dry skin with lotion, avoiding areas between your toes.  Cut toenails straight across and file the edges.  Avoid shoes that do not fit well or have areas that irritate your skin.  Avoid going barefooted or wearing only socks. Your feet need protection. TAKE CARE OF YOUR TEETH People with poorly controlled diabetes are more likely to have gum (periodontal) disease. These infections make diabetes harder to control. Periodontal diseases, if left untreated, can lead to tooth loss. Brush your teeth twice a day, floss, and see your dentist for checkups and cleaning every 6 months, or 2 times a year. ASK YOUR HEALTH CARE PROVIDER ABOUT TAKING ASPIRIN Taking aspirin daily is recommended to help prevent cardiovascular disease in people with and without diabetes. Ask your health care provider if this would benefit you and what dose he or she would recommend. DRINK RESPONSIBLY Moderate amounts of alcohol (less than 1 drink per day for adult women and less than 2 drinks per day for adult men) have a minimal effect on blood glucose if ingested with food. It is important to eat food with alcohol to avoid hypoglycemia. People should avoid alcohol if they have a history of alcohol abuse or dependence, if they are pregnant, and if they have liver disease,  pancreatitis, advanced neuropathy, or severe hypertriglyceridemia. LESSEN STRESS Living with diabetes can be stressful. When you are under stress, your blood glucose may be affected in two ways:  Stress hormones may cause your blood glucose to rise.  You may be distracted from taking good care of yourself. It is a good idea to be aware of your stress level and make changes that are necessary to help you better manage challenging situations. Support groups, planned relaxation, a hobby you enjoy, meditation, healthy relationships, and exercise all work to lower your stress level. If your efforts do not seem to be helping, get help from your health care provider or a trained mental health professional.   This information is not intended to replace advice given to you by your health care provider. Make sure you discuss any questions you have with your health care provider.   Document Released: 10/12/2010 Document Revised: 02/14/2014 Document Reviewed: 03/20/2013 Elsevier Interactive Patient Education 2016 [employer]  Insulin Storage and Care All insulin pens and bottles (vials) have expiration dates. Refrigerated, unopened insulin pens, insulin cartridges, and insulin vials are good until the expiration date. Do not  use insulin after this date. Once insulin is opened, it should be used within a certain time period. Opened means once the rubber is punctured. Always follow the instructions that come with your insulin. STORING AND CARING FOR YOUR INSULIN  If insulin is kept at room temperature, the temperature must be less than 59F (30C). Some types of insulin can be stored only at less than 2F (25C).  If insulin is kept in the refrigerator, the temperature must be between 37F and 27F (3C and 8C).  Do not freeze insulin.  Keep insulin away from direct heat or sunlight.  Throw away the insulin if it is discolored, thick, or has clumps or suspended white particles in it.  Be sure to  mix cloudy insulin by rolling between your hands gently. Pens can be "rocked" from end to end.  Opened insulin pens should be kept at room temperature.  Always have extra insulin on hand.  Never leave insulin in your vehicle.   This information is not intended to replace advice given to you by your health care provider. Make sure you discuss any questions you have with your health care provider.   Document Released: 11/21/2008 Document Revised: 09/26/2012 Document Reviewed: 06/14/2012 Elsevier Interactive Patient Education 2016 [place].  Insulin Treatment for Diabetes Diabetes is a disease that does not go away (chronic). It occurs when the body does not properly use the sugar (glucose) that is released from food after it is digested. Glucose levels are controlled by a hormone called insulin, which is made by your pancreas. Depending on the type of diabetes you have, either of the following will apply:   The pancreas does not make any insulin (type 1 diabetes).  The pancreas makes too little insulin, and the body cannot respond normally to the insulin that is made (type 2 diabetes). Without insulin, death can occur. However, with the addition of insulin, blood sugar monitoring, and treatment, someone with diabetes can live a full and productive life. This document will discuss the role of insulin in your treatment and provide information about its use.  HOW IS INSULIN GIVEN? Insulin is a medicine that can only be given by injection. Taking it by mouth makes it inactive because of the acid in your stomach. Insulin is injected under the skin by a syringe and needle, an insulin pen, a pump, or a jet injector. Your dose will be determined by your health care provider based on your individual needs. You will also be given guidance on which method of giving insulin is right for you. Remember that if you give insulin with a needle and syringe, you must do so using only a special insulin syringe  made for this purpose. WHERE ON THE BODY SHOULD INSULIN BE INJECTED? Insulin is injected into the fatty layer of tissue just under your skin. Good places to inject insulin include the upper arm, the front and outer area of the thigh, the hips, and the abdomen. Giving your insulin in the abdomen is preferred because this provides the most rapid and consistent absorption. Avoid the area 2 inches (5 cm) around the navel and avoid injecting into areas on your body with scar tissue. In addition, it is important to rotate your injection sites with every shot to prevent irritation and improve absorption. WHAT ARE THE DIFFERENT TYPES OF INSULIN?  If you have type 1 diabetes, you must take insulin to stay alive. Your body does not produce it. If you have type 2 diabetes, you might  require insulin in addition to, or instead of, other medicines. In either case, proper use of insulin is critical to control your diabetes.  There are a number of different types of insulin. Usually, you will give yourself injections, though others can be trained to give them to you. Some people have an insulin pump that delivers insulin continuously through a tube (cannula) that is placed under the skin. Using insulin requires that you check your blood sugar several times a day. The exact number of times and time of day to check will vary depending on your type of diabetes, your type of insulin, and treatment goals. Your health care provider will direct you.  Generally, different insulins have different properties. The following is a general guide. Specifics will vary by product, and new products are introduced periodically.   Rapid-acting insulin starts working quickly (in as little as 5 minutes) and wears off in 4 to 6 hours (sometimes longer). This type of insulin works well when taken just before a meal to bring your blood sugar quickly back to normal.   Short-acting insulin starts working in about 30 minutes and can last 6 to 10  hours. This type of insulin should be taken about 30 minutes before you start eating a meal.  Intermediate-acting insulin starts working in 1-2 hours and wears off after about 10 to 18 hours. This insulin will lower your blood sugar for a longer period of time, but it will not be as effective in lowering your blood sugar right after a meal.   Long-acting insulin mimics the small amount of insulin that your pancreas usually produces throughout the day. You need to have some insulin present at all times. It is crucial to the metabolism of brain cells and other cells. Long-acting insulin is meant to be used either once or twice a day. It is usually used in combination with other types of insulin, or in combination with other diabetes medicines.  Discuss the type of insulin you are taking with your health care provider or pharmacist. You will then be aware of when the insulin can be expected to peak and when it will wear off. This is important to know so you can plan for meal times and periods of exercise.  Your health care provider will usually have a strategy in mind when treating you with insulin. This will vary with your type of diabetes, your diabetes treatment goals, and your health history. It is important that you understand this strategy so you can be an active partner in treating your diabetes. Here are some terms you might hear:   Basal insulin. This refers to the small amount of insulin that needs to be present in your blood at all times. Sometimes oral medicines will be enough. For other people, and especially for people with type 1 diabetes, insulin is needed. Usually, intermediate-acting or long-acting insulin is used once or twice a day to accomplish this.   Prandial (meal-related) insulin. Your blood sugar will rise rapidly after a meal. Rapid-acting or short-acting insulin can be used right before the meal to bring your blood sugar back to normal quickly. You might be instructed to adjust  the amount of insulin depending on how much carbohydrate (starch) is in your meal.   Corrective insulin. You might be instructed to check your blood sugar at certain times of the day. You then might use a small amount of rapid-acting or short-acting insulin to bring the blood sugar down to normal if it is  elevated.   Tight control (also called intensive therapy). Tight control means keeping your blood sugar as close to your target as possible and keeping it from going too high after meals. People with tight control of their diabetes are shown to have fewer long-term problems from their diabetes.   Glycohemoglobin (also called glyco, glycosylated hemoglobin, hemoglobin A1c, or A1c) level. This measures how well your blood sugar has been controlled during the past 1 to 3 months. It helps your health care provider see how effective your treatment is and decide if any changes are needed. Your health care provider will discuss your target glycohemoglobin level with you.  Insulin treatment requires your careful attention. While you are being treated with insulin, you should check your blood glucose at least two times each day. Treatment plans will be different for different people. Some people do well with a simple program. Others require more complicated programs, with multiple insulin injections daily. You will work with your health care provider to develop the best program for you. Regardless of your insulin treatment plan, you must also do your best on weight control, diet and food choices, exercise, blood pressure control, cholesterol control, and stress levels.  WHAT ARE THE SIDE EFFECTS OF INSULIN? Although insulin treatment is important, it does have some side effects, such as:   Insulin can cause your blood sugar to go too low (hypoglycemia).   Weight gain can occur.   Improper injection technique can cause hypoglycemia, blood sugar to go too high (hyperglycemia), skin injury or irritation,  or other problems. You must learn to inject insulin properly.   This information is not intended to replace advice given to you by your health care provider. Make sure you discuss any questions you have with your health care provider.   Document Released: 04/22/2008 Document Revised: 02/14/2014 Document Reviewed: 07/08/2012 Elsevier Interactive Patient Education 2016 [employer]  Type 1 Diabetes Mellitus, Adult Type 1 diabetes mellitus, often simply referred to as diabetes, is a long-term (chronic) disease. It occurs when the islet cells in the pancreas that make insulin (a hormone) are destroyed and can no longer make insulin. Insulin is needed to move sugars from food into the tissue cells. The tissue cells use the sugars for energy. In people with type 1 diabetes, the sugars build up in the blood instead of going into the tissue cells. As a result, high blood sugar (hyperglycemia) develops. Without insulin, the body breaks down fat cells for the needed energy. This breakdown of fat cells produces acid chemicals (ketones), which increases the acid levels in the body. The effect of either high ketone or high sugar (glucose) levels can be life-threatening.  Type 1 diabetes was also previously called juvenile diabetes. It most often occurs before the age of 35, but it can occur at any age. RISK FACTORS A person is predisposed to developing type 1 diabetes if someone in his or her family has the disease and is exposed to certain additional environmental triggers.  SYMPTOMS  Symptoms of type 1 diabetes may develop gradually over days to weeks or suddenly. The symptoms occur due to hyperglycemia. The symptoms can include:   Increased thirst (polydipsia).  Increased urination (polyuria).  Increased urination during the night (nocturia).  Weight loss. This weight loss may be rapid.  Frequent, recurring infections.  Tiredness (fatigue).  Weakness.  Vision changes, such as blurred  vision.  Fruity smell to your breath.  Abdominal pain.  Nausea or vomiting.  An open skin wound (  ulcer). DIAGNOSIS  Type 1 diabetes is diagnosed when symptoms of diabetes are present and when blood glucose levels are increased. Your blood glucose level may be checked by one or more of the following blood tests:  A fasting blood glucose test. You will not be allowed to eat for at least 8 hours before a blood sample is taken.  A random blood glucose test. Your blood glucose is checked at any time of the day regardless of when you ate.  A hemoglobin A1c blood glucose test. A hemoglobin A1c test provides information about blood glucose control over the previous 3 months. TREATMENT  Although type 1 diabetes cannot be prevented, it can be managed with insulin, diet, and exercise.  You will need to take insulin daily to keep blood glucose in the desired range.  You will need to match insulin dosing with exercise and healthy food choices. Generally, the goal of treatment is to maintain a pre-meal (preprandial) blood glucose level of 80-130 mg/dL. HOME CARE INSTRUCTIONS   Have your hemoglobin A1c level checked twice a year.  Perform daily blood glucose monitoring as directed by your health care provider.  Monitor urine ketones when you are ill and as directed by your health care provider.  Take your insulin as directed by your health care provider to maintain your blood glucose level in the desired range.  Never run out of insulin. It is needed every day.  Adjust insulin based on your intake of carbohydrates. Carbohydrates can raise blood glucose levels but need to be included in your diet. Carbohydrates provide vitamins, minerals, and fiber, which are an essential part of a healthy diet. Carbohydrates are found in fruits, vegetables, whole grains, dairy products, legumes, and foods containing added sugars.  Eat healthy foods. Alternate 3 meals with 3 snacks.  Maintain a healthy  weight.  Carry a medical alert card or wear your medical alert jewelry.  Carry a 15-gram carbohydrate snack with you at all times to treat low blood glucose (hypoglycemia). Some examples of 15-gram carbohydrate snacks include:  Glucose tablets, 3 or 4.  Glucose gel, 15-gram tube.  Raisins, 2 tablespoons (24 grams).  Jelly beans, 6.  Animal crackers, 8.  Fruit juice, regular soda, or low-fat milk, 4 ounces (120 mL).  Gummy treats, 9.  Recognize hypoglycemia. Hypoglycemia occurs with blood glucose levels of 70 mg/dL and below. The risk for hypoglycemia increases when fasting or skipping meals, during or after intense exercise, and during sleep. Hypoglycemia symptoms can include:  Tremors or shakes.  Decreased ability to concentrate.  Sweating.  Increased heart rate.  Headache.  Dry mouth.  Hunger.  Irritability.  Anxiety.  Restless sleep.  Altered speech or coordination.  Confusion.  Treat hypoglycemia promptly. If you are alert and able to safely swallow, follow the 15:15 rule:  Take 15-20 grams of rapid-acting glucose or carbohydrate. Rapid-acting options include glucose gel, glucose tablets, or 4 ounces (120 mL) of fruit juice, regular soda, or low-fat milk.  Check your blood glucose level 15 minutes after taking the glucose.  Take 15-20 grams more of glucose if the repeat blood glucose level is still 70 mg/dL or below.  Eat a meal or snack within 1 hour once blood glucose levels return to normal.  Be alert to polyuria and polydipsia, which are early signs of hyperglycemia. An early awareness of hyperglycemia allows for prompt treatment. Treat hyperglycemia as directed by your health care provider.  Exercise regularly as directed by your health care provider. This includes:  Stretching and performing strength training exercises, such as yoga or weight lifting, at least 2 times per week.  Performing a total of at least 150 minutes of moderate-intensity  exercise each week, such as brisk walking or water aerobics.  Exercising at least 3 days per week, making sure you allow no more than 2 consecutive days to pass without exercising.  Avoiding long periods of inactivity (90 minutes or more). When you have to spend an extended period of time sitting down, take frequent breaks to walk or stretch.  Adjust your insulin dosing and food intake as needed if you start a new exercise or sport.  Follow your sick-day plan at any time you are unable to eat or drink as usual.   Do not use any tobacco products including cigarettes, chewing tobacco, or electronic cigarettes. If you need help quitting, ask your health care provider.  Limit alcohol intake to no more than 1 drink per day for nonpregnant women and 2 drinks per day for men. You should drink alcohol only when you are also eating food. Talk with your health care provider about whether alcohol is safe for you. Tell your health care provider if you drink alcohol several times a week.  Keep all follow-up visits as directed by your health care provider.  Schedule an eye exam within 5 years of diagnosis and then annually.  Perform daily skin and foot care. Examine your skin and feet daily for cuts, bruises, redness, nail problems, bleeding, blisters, or sores. A foot exam should be done by a health care provider 5 years after diagnosis, and then every year after the first exam.  Brush your teeth and gums at least twice a day and floss at least once a day. Follow up with your dentist regularly.  Share your diabetes management plan with your workplace or school.  Keep your immunizations up to date. It is recommended that you receive a flu (influenza) vaccine every year. It is also recommended that you receive a pneumonia (pneumococcal) vaccine. If you are 84 years of age or older and have never received a pneumonia vaccine, this vaccine may be given as a series of two separate shots. Ask your health care  provider which additional vaccines may be recommended.  Learn to manage stress.  Obtain ongoing diabetes education and support as needed.  Participate in or seek rehabilitation as needed to maintain or improve independence and quality of life. Request a physical or occupational therapy referral if you are having foot or hand numbness, or difficulties with grooming, dressing, eating, or physical activity. SEEK MEDICAL CARE IF:   You are unable to eat food or drink fluids for more than 6 hours.  You have nausea and vomiting for more than 6 hours.  Your blood glucose level is over 240 mg/dL.  There is a change in mental status.  You develop an additional serious illness.  You have diarrhea for more than 6 hours.  You have been sick or have had a fever for a couple of days and are not getting better.  You have pain during any physical activity. SEEK IMMEDIATE MEDICAL CARE IF:  You have difficulty breathing.  You have moderate to large ketone levels. MAKE SURE YOU:  Understand these instructions.  Will watch your condition.  Will get help right away if you are not doing well or get worse.   This information is not intended to replace advice given to you by your health care provider. Make sure you discuss  any questions you have with your health care provider.   Document Released: 01/22/2000 Document Revised: 10/15/2014 Document Reviewed: 08/23/2011 Elsevier Interactive Patient Education 2016 [employer]  Urine Ketone Test A urine ketone test is a test that you do at home to measure the amount of ketones passed through your urine. Ketones are the waste produced when your body breaks down fat to use for energy. Your body uses fat for energy when it cannot use sugar (glucose) for energy. This can happen when there is not enough glucose in your blood for your body to use or when your body is not producing enough insulin to be able to use the glucose for energy. Urine ketone tests  are used for two very different reasons:  To make sure that your body is producing ketones if you are on a special diet to force your body to use fat for energy (ketogenic diet).  To make sure that your body is not producing ketones if you have diabetes mellitus. A large amount of ketones in your body combined with high levels of blood glucose can cause a reaction that can make you very sick. WHERE CAN I GET A KETONE TEST AND HOW DO I USE IT?  Ketone tests are available at your pharmacy without a prescription. Before you buy the test, check the expiration date to make sure that the test has not expired. Follow the instructions included with the test. The tests usually include the following steps: 1. Wash and dry your hands. 2. Collect a sample of your urine in a clean container. 3. Dip the ketone test strip into your urine quickly and remove it. 4. Watch to see if the test strip pad changes color. The directions will tell you how long to wait. 5. Compare the color of your test strip pad to the color chart listed on the test strip bottle. 6. Record your results as instructed by your health care provider. 7. Wash and dry your hands. WHEN DO I TEST FOR KETONES? If you are on a ketogenic diet, you will use the urine ketone test regularly to make sure that your body is burning fat for energy. If you have diabetes mellitus, ask your health care provider when you should check for ketones. Your health care provider may tell you to test for ketones when:  Your blood glucose level is greater than 240 mg/dL.  You have a cold, flu, or fever.  You are vomiting or feel nauseated.  You are pregnant.  You are very thirsty.  You feel confused or disoriented. WHAT IF I FIND KETONES IN MY URINE? If you are on a ketogenic diet, you will expect to find ketones in your urine. However, if you have diabetes mellitus, a test result that shows ketones in your urine is considered abnormal. Depending on your exact  test type, the abnormal results are usually grouped by amount as follows:  Small: Less than 20 mg/dL  Moderate: 30-40 mg/dL  Large: More than 80 mg/dL If you have diabetes mellitus, your health care provider will instruct you on what to do according to your result. Usually if you find small amounts of ketones you will be told to drink a lot of water (at least 2 large glasses) and check every 2 hours until you find no more. Depending on your blood glucose level, moderate or large amounts of ketones can be dangerous and could mean that your diabetes mellitus is out of control. If this happens, you need to contact  your health care provider right away.   This information is not intended to replace advice given to you by your health care provider. Make sure you discuss any questions you have with your health care provider.   Document Released: 01/29/2013 Document Revised: 02/14/2014 Document Reviewed: 06/19/2013 [employer] Interactive Patient Education [employer].

## 2015-12-19 NOTE — Discharge Summary (Signed)
Physician Discharge Summary  Briana Lopez [id number] DOB: Apr 06, 1965 DOA: 12/18/2015  PCP: Vikki Ports, MD  Admit date: 12/18/2015 Discharge date: 12/19/2015  Admitted From: Home Disposition:  Home   Recommendations for Outpatient Follow-up:  1. Follow up with PCP in 1 weeks 2. Follow up with endocrinologist in 1-2 weeks 3. Please obtain BMP/CBC in one week  Discharge Condition: Stable CODE STATUS: FULL  Diet recommendation: Heart Healthy / Carb Modified  Brief/Interim Summary: HPI: Briana Lopez is a 50 y.o. woman with a history of Type 1 Diabetes (managed with an insulin pump (OmniPod)/followed at [facility]) and hypercholesterolemia who presents with intractable nausea and vomiting for less than 24 hours prior to presentation.  She has had an intolerance to solids and liquids.  She denies fever, but she has had chills and sweats.  No known sick contacts.  No cough.  No dysuria.  No diarrhea, but she is having abdominal cramps.  She also reports that she just started her period today.    ED Course: Patient has evidence of DKA and dehydration/AKI with glucose 338, bicarb 8, potassium 5.9, BUN 34, Creatinine 1.39.  Venous pH 7.040.  WBC count 18 but no obvious source of infection.  U/A shows ketones, glucose, protein, and blood (but the blood is likely a result of contamination due active menstrual cycle).  She has received 2.5L NS via bolus.  Insulin drip per protocol has been initiated.  CT scan shows hepatic steatosis but no other acute findings that would explain her DKA.  Pt was admitted to ICU and started on continuous IV insulin infusion via DKA protocol.  She was initially kept NPO.  Frequent BMP tests were ordered to Monitor anion gap until it closed. The patient improved overnight and began a diet on the IV insulin infusion with supplemental prandial insulin given with meals. The patient had no nausea or vomiting and tolerated diet very well. She had a negative urine drug  screen is negative EKG and chest x-ray. Urinalysis was without infection. No specific cause for DKA found but I am suspicious that pump failure may have been the cause or the invokana. She does use the Omni pod insulin pump system.  She advised to hold the Invokana until she sees her endocrinologist as this drug has been associated with DKA.  She was counseled regarding close monitoring of blood glucose. The patient elected to go home and did not want to stay in the hospital any longer. She decided to go back on her insulin pump. We transitioned her off the IV insulin infusion and then restarted the insulin pump. She seemed to tolerate this very well. She was given strict instructions regarding glycemic monitoring and signs and symptoms of DKA which she verbalized understanding. She reported that she will follow closely with her primary care provider and diabetologist endocrinologist at [facility]. The patient agreed to drink plenty of extra fluids, rest and have close outpatient follow-up.  I asked her again if she would like to stay in the hospital longer on IV insulin and she declined. I discussed the risks with her and her husband and they verbalized understanding.  Lactic acid normalized prior to discharge. Influenza test negative. Patient was given strict instructions to return if symptoms recur, or new problems develop and the patient verbalized understanding.  Discharge Diagnoses:  Principal Problem:   DKA, type 1 ([place]) Active Problems:   Lactic acidosis   Leukocytosis   Hyperkalemia   AKI (acute kidney injury) ([place])  DKA (diabetic ketoacidoses) [facility]  Discharge Instructions  Discharge Instructions    Increase activity slowly    Complete by:  As directed        Medication List    STOP taking these medications   canagliflozin 100 MG Tabs tablet Commonly known as:  INVOKANA     TAKE these medications   ALPRAZolam 0.5 MG tablet Commonly known as:  XANAX Take 0.5-1 tablets (0.25-0.5 mg  total) by mouth 3 (three) times daily as needed for anxiety (for flying).   ibuprofen 200 MG tablet Commonly known as:  ADVIL,MOTRIN Take 800 mg by mouth every 8 (eight) hours as needed for headache or cramping (pain).   insulin aspart 100 UNIT/ML injection Commonly known as:  novoLOG Inject into the skin 3 (three) times daily before meals.   levothyroxine 50 MCG tablet Commonly known as:  SYNTHROID, LEVOTHROID Take 25 mcg by mouth daily before breakfast.   multivitamin with minerals tablet Take 1 tablet by mouth daily.   ALIVE WOMENS 50+ PO Take 1 tablet by mouth daily.   PROBIOTIC DAILY PO Take 1 tablet by mouth daily.      Follow-up Information    KNAPP,EVE A, MD. Schedule an appointment as soon as possible for a visit in 1 week(s).   Specialty:  Family Medicine Why:  Hospital Follow Up  Contact information: [place] [address] [phone]        Barnet Pall, MD. Schedule an appointment as soon as possible for a visit in 2 week(s).   Specialty:  Endocrinology Why:  Hospital Follow Up  Contact information: [place] [address] [phone]          Allergies  Allergen Reactions  . Penicillins     As a child  Has patient had a PCN reaction causing immediate rash, facial/tongue/throat swelling, SOB or lightheadedness with hypotension: unknown Has patient had a PCN reaction causing severe rash involving mucus membranes or skin necrosis: unknown Has patient had a PCN reaction that required hospitalization: unknown Has patient had a PCN reaction occurring within the last 10 years: no If all of the above answers are "NO", then may proceed with Cephalosporin use.   Procedures/Studies: Ct Abdomen Pelvis W Contrast  Result Date: 12/18/2015 CLINICAL DATA:  Initial evaluation for acute abdominal pain, nausea, vomiting. EXAM: CT ABDOMEN AND PELVIS WITH CONTRAST TECHNIQUE:  Multidetector CT imaging of the abdomen and pelvis was performed using the standard protocol following bolus administration of intravenous contrast. CONTRAST:  116mL ISOVUE-300 IOPAMIDOL (ISOVUE-300) INJECTION 61% COMPARISON:  None. FINDINGS: Lower chest: Mild left basilar atelectasis. Visualized lung bases are otherwise clear. Hepatobiliary: Diffuse hypoattenuation liver consistent with steatosis. Liver otherwise unremarkable. Gallbladder within normal limits. No biliary dilatation. Pancreas: Pancreas within normal limits. Spleen: Spleen within normal limits. Adrenals/Urinary Tract: Adrenal glands are normal. Kidneys are equal in size with symmetric enhancement. No nephrolithiasis, hydronephrosis, or focal enhancing renal mass. No acute abnormality seen along the course of either ureter. Bladder well distended and within normal limits. Stomach/Bowel: Stomach mildly distended with intraluminal fluid density within the gastric lumen. Mildly prominent mucosal enhancement within the stomach, likely related to timing of the contrast bolus. Known gastric wall thickening to suggest acute gastritis. Small bowel of normal caliber without evidence for obstruction or acute inflammatory changes. Appendix not discretely identified, however, no inflammatory changes are seen within the right lower quadrant about the cecum to suggest acute appendicitis. Colon within  normal limits without acute inflammatory changes. Vascular/Lymphatic: Normal intravascular enhancement seen throughout the intra-abdominal aorta and its branch vessels. Mild aorto bi-iliac atherosclerotic disease. No aneurysm. No pathologically enlarged intra-abdominal or pelvic lymph nodes identified. Reproductive: Uterus and ovaries within normal limits. Other: No free intraperitoneal air.  No free fluid. Musculoskeletal: No acute osseous abnormality. No worrisome lytic or blastic osseous lesions. IMPRESSION: 1. No CT evidence for acute intra-abdominal or pelvic  process. 2. Hepatic steatosis. Electronically Signed   By: Jeannine Boga M.D.   On: 12/18/2015 21:04     Subjective: Pt says that she wants to go home.  She is not wanting to stay any longer.  She has been eating and drinking with no problems, ambulating. She feels back to her baseline and wants to go.  She says that she will be sure to follow closely with her primary care physician and endocrinologist.  Discharge Exam: Vitals:   12/19/15 1300 12/19/15 1400  BP:    Pulse: 72 81  Resp: 16 (!) 25  Temp:     Vitals:   12/19/15 1100 12/19/15 1200 12/19/15 1300 12/19/15 1400  BP:      Pulse: 77 79 72 81  Resp: 19 16 16  (!) 25  Temp:      TempSrc:      SpO2: 98% 99% 99% 100%  Weight:      Height:        General: Pt is alert, awake, not in acute distress Cardiovascular: RRR, S1/S2 +, no rubs, no gallops Respiratory: CTA bilaterally, no wheezing, no rhonchi Abdominal: Soft, NT, ND, bowel sounds + Extremities: no edema, no cyanosis Neurological: no focal findings   The results of significant diagnostics from this hospitalization (including imaging, microbiology, ancillary and laboratory) are listed below for reference.     Microbiology: Recent Results (from the past 240 hour(s))  MRSA PCR Screening     Status: None   Collection Time: 12/18/15 10:54 PM  Result Value Ref Range Status   MRSA by PCR NEGATIVE NEGATIVE Final    Comment:        The GeneXpert MRSA Assay (FDA approved for NASAL specimens only), is one component of a comprehensive MRSA colonization surveillance program. It is not intended to diagnose MRSA infection nor to guide or monitor treatment for MRSA infections.      Labs: BNP (last 3 results) No results for input(s): BNP in the last 8760 hours. Basic Metabolic Panel:  Recent Labs Lab 12/18/15 2330 12/19/15 0320 12/19/15 0647 12/19/15 1050 12/19/15 1328  NA 139 139 137 136 136  K 5.1 4.3 4.1 4.1 3.8  CL 115* 119* 117* 115* 114*  CO2  <7* 11* 15* 16* 16*  GLUCOSE 276* 183* 129* 138* 169*  BUN 28* 25* 24* 22* 21*  CREATININE 1.29* 1.19* 1.08* 0.99 0.95  CALCIUM 8.1* 8.2* 8.1* 8.2* 8.0*   Liver Function Tests:  Recent Labs Lab 12/18/15 1802  AST 30  ALT 27  ALKPHOS 72  BILITOT 1.3*  PROT 8.2*  ALBUMIN 4.9    Recent Labs Lab 12/18/15 1802  LIPASE 12   No results for input(s): AMMONIA in the last 168 hours. CBC:  Recent Labs Lab 12/18/15 1718 12/19/15 0645  WBC 18.1* 14.6*  NEUTROABS  --  12.2*  HGB 15.9* 12.2  HCT 48.9* 37.3  MCV 93.3 91.6  PLT 345 319   Cardiac Enzymes: No results for input(s): CKTOTAL, CKMB, CKMBINDEX, TROPONINI in the last 168 hours. BNP: Invalid input(s): POCBNP CBG:  Recent Labs Lab 12/19/15 1050 12/19/15 1201 12/19/15 1306 12/19/15 1403 12/19/15 1508  GLUCAP 143* 158* 162* 140* 176*   D-Dimer No results for input(s): DDIMER in the last 72 hours. Hgb A1c No results for input(s): HGBA1C in the last 72 hours. Lipid Profile No results for input(s): CHOL, HDL, LDLCALC, TRIG, CHOLHDL, LDLDIRECT in the last 72 hours. Thyroid function studies No results for input(s): TSH, T4TOTAL, T3FREE, THYROIDAB in the last 72 hours.  Invalid input(s): FREET3 Anemia work up No results for input(s): VITAMINB12, FOLATE, FERRITIN, TIBC, IRON, RETICCTPCT in the last 72 hours. Urinalysis    Component Value Date/Time   COLORURINE YELLOW 12/18/2015 1700   APPEARANCEUR CLOUDY (A) 12/18/2015 1700   LABSPEC 1.027 12/18/2015 1700   PHURINE 5.5 12/18/2015 1700   GLUCOSEU >1000 (A) 12/18/2015 1700   HGBUR LARGE (A) 12/18/2015 1700   BILIRUBINUR NEGATIVE 12/18/2015 1700   BILIRUBINUR neg 09/25/2014 1406   KETONESUR >80 (A) 12/18/2015 1700   PROTEINUR 30 (A) 12/18/2015 1700   UROBILINOGEN negative 09/25/2014 1406   NITRITE NEGATIVE 12/18/2015 1700   LEUKOCYTESUR NEGATIVE 12/18/2015 1700   Sepsis Labs Invalid input(s): PROCALCITONIN,  WBC,  LACTICIDVEN Microbiology Recent Results  (from the past 240 hour(s))  MRSA PCR Screening     Status: None   Collection Time: 12/18/15 10:54 PM  Result Value Ref Range Status   MRSA by PCR NEGATIVE NEGATIVE Final    Comment:        The GeneXpert MRSA Assay (FDA approved for NASAL specimens only), is one component of a comprehensive MRSA colonization surveillance program. It is not intended to diagnose MRSA infection nor to guide or monitor treatment for MRSA infections.    Time coordinating discharge: Over 30 minutes  SIGNED:  Irwin Brakeman, MD  [facility] 12/19/2015, 4:07 PM Pager   If 7PM-7AM, please contact night-coverage [email or web address] Password [email or web address]

## 2015-12-19 NOTE — Progress Notes (Signed)
PROGRESS NOTE    Briana Lopez  [id number]  DOB: November 26, 1965  DOA: 12/18/2015 PCP: Vikki Ports, MD Outpatient Specialists:  Hospital course: Briana Lopez is a 50 y.o. woman with a history of Type 1 Diabetes (managed with an insulin pump/followed at [facility]) and hypercholesterolemia who presents with intractable nausea and vomiting for less than 24 hours prior to presentation.  She has had an intolerance to solids and liquids.  She denies fever, but she has had chills and sweats.  No known sick contacts.  No cough.  No dysuria.  No diarrhea, but she is having abdominal cramps.  She also reports that she just started her period today.    Assessment & Plan:   DKA, trigger unclear but suspect insulin pump related.  Patient complains of chills and sweats but no definitive source of infection identified at this time. --IV insulin per protocol, patient has detached her own insulin pump. --IV fluids per protocol --BMP q4h --Follow lactic acid level  --Blood cultures and chest xray for fever greater than 100.4 --UDS negative -- A1c pending -- DKA is improving but not fully resolved, continue IV insulin until CO2 has fully normalized, can start eating but will give additional novolog injection with meal  AKI secondary to dehydration due to N/V --Expect improvement with IV fluid resusciation --Improving slowly with hydration therapy.   Abdominal pain, she thinks it "may just be cramps" related to her menstrual cycle.  She does not have a surgical abdomen. --Avoid NSAIDS for now --Options for alternative analgesics given  Hypothyroidism --Continue home dose of levothyroxine  Anxiety --Xanax prn  Subjective: Pt really wants to start eating but still on insulin drip.   Objective: Vitals:   12/19/15 0400 12/19/15 0500 12/19/15 0600 12/19/15 0800  BP: (!) 117/47     Pulse: 90 88 83   Resp: 17 16 16    Temp: 98 F (36.7 C)   98.3 F (36.8 C)  TempSrc:    Oral    SpO2: 98% 99% 99%   Weight:      Height:        Intake/Output Summary (Last 24 hours) at 12/19/15 0850 Last data filed at 12/19/15 0600  Gross per 24 hour  Intake          4095.98 ml  Output              400 ml  Net          3695.98 ml   Filed Weights   12/18/15 2300  Weight: 55.2 kg (121 lb 11.1 oz)    Exam:  Eyes: PERRL, lids and conjunctivae normal ENMT: Mucous membranes are slightly dry. Posterior pharynx clear of any exudate or lesions. Normal dentition.  Neck: normal appearance, supple Respiratory: clear to auscultation bilaterally, no wheezing, no crackles. Normal respiratory effort. No accessory muscle use.  Cardiovascular: Mildly tachycardic but regular.  No murmurs / rubs / gallops. No extremity edema. 2+ pedal pulses.  GI: abdomen is soft and compressible.  No distention.  No guarding but she complains of diffuse pains.  No tenderness.  No masses palpated.  Bowel sounds are present. Musculoskeletal:  No joint deformity in upper and lower extremities. Good ROM, no contractures. Normal muscle tone.  Skin: no rashes, warm and dry Neurologic: CN 2-12 grossly intact. Sensation intact, Strength symmetric bilaterally, 5/5  Psychiatric: Normal judgment and insight. Alert and oriented x 3. Normal mood.    Data Reviewed: Basic Metabolic Panel:  Recent Labs Lab 12/18/15  1802 12/18/15 2330 12/19/15 0320 12/19/15 0647  NA 138 139 139 137  K 5.9* 5.1 4.3 4.1  CL 107 115* 119* 117*  CO2 8* <7* 11* 15*  GLUCOSE 338* 276* 183* 129*  BUN 34* 28* 25* 24*  CREATININE 1.39* 1.29* 1.19* 1.08*  CALCIUM 9.3 8.1* 8.2* 8.1*   Liver Function Tests:  Recent Labs Lab 12/18/15 1802  AST 30  ALT 27  ALKPHOS 72  BILITOT 1.3*  PROT 8.2*  ALBUMIN 4.9    Recent Labs Lab 12/18/15 1802  LIPASE 12   No results for input(s): AMMONIA in the last 168 hours. CBC:  Recent Labs Lab 12/18/15 1718  WBC 18.1*  HGB 15.9*  HCT 48.9*  MCV 93.3  PLT 345   Cardiac  Enzymes: No results for input(s): CKTOTAL, CKMB, CKMBINDEX, TROPONINI in the last 168 hours. CBG (last 3)   Recent Labs  12/19/15 0539 12/19/15 0641 12/19/15 0740  GLUCAP 141* 133* 113*   Recent Results (from the past 240 hour(s))  MRSA PCR Screening     Status: None   Collection Time: 12/18/15 10:54 PM  Result Value Ref Range Status   MRSA by PCR NEGATIVE NEGATIVE Final    Comment:        The GeneXpert MRSA Assay (FDA approved for NASAL specimens only), is one component of a comprehensive MRSA colonization surveillance program. It is not intended to diagnose MRSA infection nor to guide or monitor treatment for MRSA infections.      Studies: Ct Abdomen Pelvis W Contrast  Result Date: 12/18/2015 CLINICAL DATA:  Initial evaluation for acute abdominal pain, nausea, vomiting. EXAM: CT ABDOMEN AND PELVIS WITH CONTRAST TECHNIQUE: Multidetector CT imaging of the abdomen and pelvis was performed using the standard protocol following bolus administration of intravenous contrast. CONTRAST:  160mL ISOVUE-300 IOPAMIDOL (ISOVUE-300) INJECTION 61% COMPARISON:  None. FINDINGS: Lower chest: Mild left basilar atelectasis. Visualized lung bases are otherwise clear. Hepatobiliary: Diffuse hypoattenuation liver consistent with steatosis. Liver otherwise unremarkable. Gallbladder within normal limits. No biliary dilatation. Pancreas: Pancreas within normal limits. Spleen: Spleen within normal limits. Adrenals/Urinary Tract: Adrenal glands are normal. Kidneys are equal in size with symmetric enhancement. No nephrolithiasis, hydronephrosis, or focal enhancing renal mass. No acute abnormality seen along the course of either ureter. Bladder well distended and within normal limits. Stomach/Bowel: Stomach mildly distended with intraluminal fluid density within the gastric lumen. Mildly prominent mucosal enhancement within the stomach, likely related to timing of the contrast bolus. Known gastric wall  thickening to suggest acute gastritis. Small bowel of normal caliber without evidence for obstruction or acute inflammatory changes. Appendix not discretely identified, however, no inflammatory changes are seen within the right lower quadrant about the cecum to suggest acute appendicitis. Colon within normal limits without acute inflammatory changes. Vascular/Lymphatic: Normal intravascular enhancement seen throughout the intra-abdominal aorta and its branch vessels. Mild aorto bi-iliac atherosclerotic disease. No aneurysm. No pathologically enlarged intra-abdominal or pelvic lymph nodes identified. Reproductive: Uterus and ovaries within normal limits. Other: No free intraperitoneal air.  No free fluid. Musculoskeletal: No acute osseous abnormality. No worrisome lytic or blastic osseous lesions. IMPRESSION: 1. No CT evidence for acute intra-abdominal or pelvic process. 2. Hepatic steatosis. Electronically Signed   By: Jeannine Boga M.D.   On: 12/18/2015 21:04     Scheduled Meds: . insulin aspart  4 Units Subcutaneous TID WC  . levothyroxine  25 mcg Oral QAC breakfast   Continuous Infusions: . sodium chloride    . dextrose 5 %  and 0.45% NaCl 125 mL/hr at 12/19/15 0645  . dextrose 5 % and 0.45% NaCl 0 mL/hr at 12/18/15 2324  . insulin (NOVOLIN-R) infusion Stopped (12/19/15 0759)   Principal Problem:   DKA, type 1 ([place]) Active Problems:   Lactic acidosis   Leukocytosis   Hyperkalemia   AKI (acute kidney injury) ([place])   DKA (diabetic ketoacidoses) [facility])  Critical Care Time spent: 53 mins  Irwin Brakeman, MD, FAAFP [facility] Pager [phone]  If 7PM-7AM, please contact night-coverage [email or web address] Password [email or web address] 12/19/2015, 8:50 AM    LOS: 1 day

## 2015-12-20 LAB — HEMOGLOBIN A1C
HEMOGLOBIN A1C: 6.6 % — AB (ref 4.8–5.6)
MEAN PLASMA GLUCOSE: 143 mg/dL

## 2015-12-21 ENCOUNTER — Telehealth: Payer: Self-pay | Admitting: Family Medicine

## 2015-12-21 LAB — VOLATILES,BLD-ACETONE,ETHANOL,ISOPROP,METHANOL
ACETONE, BLOOD: NEGATIVE % (ref 0.000–0.010)
Ethanol, blood: NEGATIVE % (ref 0.000–0.010)
Isopropanol, blood: NEGATIVE % (ref 0.000–0.010)
Methanol, blood: NEGATIVE % (ref 0.000–0.010)

## 2015-12-21 LAB — GLUCOSE, CAPILLARY: GLUCOSE-CAPILLARY: 265 mg/dL — AB (ref 65–99)

## 2015-12-21 NOTE — Telephone Encounter (Signed)
Called pt concerning recent hospital stay. Pt made an appt with Vickie for hospital follow up. Pt states she is feeling much better.

## 2015-12-24 ENCOUNTER — Ambulatory Visit (INDEPENDENT_AMBULATORY_CARE_PROVIDER_SITE_OTHER): Payer: BLUE CROSS/BLUE SHIELD | Admitting: Family Medicine

## 2015-12-24 ENCOUNTER — Encounter: Payer: Self-pay | Admitting: Family Medicine

## 2015-12-24 VITALS — BP 120/80 | HR 84 | Wt 128.4 lb

## 2015-12-24 DIAGNOSIS — Z09 Encounter for follow-up examination after completed treatment for conditions other than malignant neoplasm: Secondary | ICD-10-CM

## 2015-12-24 DIAGNOSIS — E1010 Type 1 diabetes mellitus with ketoacidosis without coma: Secondary | ICD-10-CM | POA: Diagnosis not present

## 2015-12-24 DIAGNOSIS — N179 Acute kidney failure, unspecified: Secondary | ICD-10-CM | POA: Diagnosis not present

## 2015-12-24 DIAGNOSIS — D72829 Elevated white blood cell count, unspecified: Secondary | ICD-10-CM | POA: Diagnosis not present

## 2015-12-24 LAB — CBC WITH DIFFERENTIAL/PLATELET
Basophils Absolute: 0 cells/uL (ref 0–200)
Basophils Relative: 0 %
Eosinophils Absolute: 65 cells/uL (ref 15–500)
Eosinophils Relative: 1 %
HEMATOCRIT: 37.9 % (ref 35.0–45.0)
Hemoglobin: 12.4 g/dL (ref 11.7–15.5)
LYMPHS PCT: 19 %
Lymphs Abs: 1235 cells/uL (ref 850–3900)
MCH: 29.5 pg (ref 27.0–33.0)
MCHC: 32.7 g/dL (ref 32.0–36.0)
MCV: 90.2 fL (ref 80.0–100.0)
MONO ABS: 520 {cells}/uL (ref 200–950)
MONOS PCT: 8 %
MPV: 9 fL (ref 7.5–12.5)
NEUTROS PCT: 72 %
Neutro Abs: 4680 cells/uL (ref 1500–7800)
PLATELETS: 310 10*3/uL (ref 140–400)
RBC: 4.2 MIL/uL (ref 3.80–5.10)
RDW: 14.4 % (ref 11.0–15.0)
WBC: 6.5 10*3/uL (ref 4.0–10.5)

## 2015-12-24 LAB — BASIC METABOLIC PANEL
BUN: 22 mg/dL (ref 7–25)
CO2: 26 mmol/L (ref 20–31)
CREATININE: 0.85 mg/dL (ref 0.50–1.10)
Calcium: 9 mg/dL (ref 8.6–10.2)
Chloride: 104 mmol/L (ref 98–110)
Glucose, Bld: 92 mg/dL (ref 65–99)
POTASSIUM: 4.1 mmol/L (ref 3.5–5.3)
Sodium: 138 mmol/L (ref 135–146)

## 2015-12-24 NOTE — Progress Notes (Signed)
   Subjective:    Patient ID: Briana Lopez, female    DOB: 10-Nov-1965, 51 y.o.   MRN: [id number]  HPI Chief Complaint  Patient presents with  . hospital    hospital follow-up   She is a 50 year old female with a history of Type 1 Diabetes (mangaged with an insulin pump) who is here for hospital follow up for DKA and AKI. She had an acute onset of nausea and vomiting on December 18, 2015 and was admitted to ICU overnight. She was treated and quickly turned around and discharged home approximately 24 hours later.   It was suspected that an insulin pump failure or possibly her invokana could have been the reason for DKA. She was advised to stop invokana and hold this until she could see her endocrinologist again in [place]. She states she started taking it again because it seems to control her blood sugars better. States she thinks she had food poisoning or possibly a GI bug and that caused her to become seriously ill.   She is feeling better and back to exercising. Trying to stay hydrated. Her blood sugars have been between 69-120 fasting which is where they normally are. She does not take insulin unless her blood sugar is at least 120.   Denies fever, chills, headache, dizziness, chest pain, nausea, vomiting, diarrhea.     Review of Systems Pertinent positives and negatives in the history of present illness.     Objective:   Physical Exam BP 120/80   Pulse 84   Wt 128 lb 6.4 oz (58.2 kg)   BMI 21.70 kg/m   Alert and oriented and in no acute distress. Not otherwise examined.      Assessment & Plan:  Hospital discharge follow-up - Plan: CBC with Differential/Platelet, Basic metabolic panel  Type 1 diabetes mellitus with ketoacidosis without coma ([place]) - Plan: CBC with Differential/Platelet, Basic metabolic panel  AKI (acute kidney injury) ([place]) - Plan: Basic metabolic panel  Leukocytosis, unspecified type - Plan: CBC with Differential/Platelet  Discussed that she appears  to be doing well and she reports feeling almost back to her norm. No issues or concerns with insulin pump and blood sugars have normalized. She has been exercising. Encouraged her to stay well hydrated and schedule an appointment with her endocrinologist in [place] next week at recommended at discharge.  Plan to check CBC and BMP and follow up pending labs.

## 2015-12-29 ENCOUNTER — Telehealth: Payer: Self-pay | Admitting: Internal Medicine

## 2015-12-29 NOTE — Telephone Encounter (Signed)
1- pt has an appt on 01/06/16 2- feeling fine and doing well 3- BS is in normal range

## 2015-12-29 NOTE — Telephone Encounter (Signed)
Left message for pt to call me back  1- was she able to get another endo appt before her January visit. 2- how is she feeling 3- is her BS still in normal range

## 2016-01-06 DIAGNOSIS — Z809 Family history of malignant neoplasm, unspecified: Secondary | ICD-10-CM | POA: Diagnosis not present

## 2016-01-06 DIAGNOSIS — F411 Generalized anxiety disorder: Secondary | ICD-10-CM | POA: Diagnosis not present

## 2016-01-06 DIAGNOSIS — Z880 Allergy status to penicillin: Secondary | ICD-10-CM | POA: Diagnosis not present

## 2016-01-06 DIAGNOSIS — E109 Type 1 diabetes mellitus without complications: Secondary | ICD-10-CM | POA: Diagnosis not present

## 2016-01-06 DIAGNOSIS — Z79899 Other long term (current) drug therapy: Secondary | ICD-10-CM | POA: Diagnosis not present

## 2016-01-06 DIAGNOSIS — Z87891 Personal history of nicotine dependence: Secondary | ICD-10-CM | POA: Diagnosis not present

## 2016-01-06 DIAGNOSIS — E10649 Type 1 diabetes mellitus with hypoglycemia without coma: Secondary | ICD-10-CM | POA: Diagnosis not present

## 2016-01-06 DIAGNOSIS — Z9641 Presence of insulin pump (external) (internal): Secondary | ICD-10-CM | POA: Diagnosis not present

## 2016-01-06 DIAGNOSIS — R946 Abnormal results of thyroid function studies: Secondary | ICD-10-CM | POA: Diagnosis not present

## 2016-01-25 DIAGNOSIS — E109 Type 1 diabetes mellitus without complications: Secondary | ICD-10-CM | POA: Diagnosis not present

## 2016-01-26 DIAGNOSIS — E109 Type 1 diabetes mellitus without complications: Secondary | ICD-10-CM | POA: Diagnosis not present

## 2016-02-05 DIAGNOSIS — E109 Type 1 diabetes mellitus without complications: Secondary | ICD-10-CM | POA: Diagnosis not present

## 2016-02-05 DIAGNOSIS — E1065 Type 1 diabetes mellitus with hyperglycemia: Secondary | ICD-10-CM | POA: Diagnosis not present

## 2016-04-08 DIAGNOSIS — J069 Acute upper respiratory infection, unspecified: Secondary | ICD-10-CM | POA: Diagnosis not present

## 2016-05-04 DIAGNOSIS — H5213 Myopia, bilateral: Secondary | ICD-10-CM | POA: Diagnosis not present

## 2016-05-04 DIAGNOSIS — E103291 Type 1 diabetes mellitus with mild nonproliferative diabetic retinopathy without macular edema, right eye: Secondary | ICD-10-CM | POA: Diagnosis not present

## 2016-06-06 DIAGNOSIS — R946 Abnormal results of thyroid function studies: Secondary | ICD-10-CM | POA: Diagnosis not present

## 2016-06-06 DIAGNOSIS — Z6821 Body mass index (BMI) 21.0-21.9, adult: Secondary | ICD-10-CM | POA: Diagnosis not present

## 2016-06-06 DIAGNOSIS — R0683 Snoring: Secondary | ICD-10-CM | POA: Diagnosis not present

## 2016-06-06 DIAGNOSIS — E10649 Type 1 diabetes mellitus with hypoglycemia without coma: Secondary | ICD-10-CM | POA: Diagnosis not present

## 2016-06-06 DIAGNOSIS — Z87891 Personal history of nicotine dependence: Secondary | ICD-10-CM | POA: Diagnosis not present

## 2016-06-06 DIAGNOSIS — M21619 Bunion of unspecified foot: Secondary | ICD-10-CM | POA: Diagnosis not present

## 2016-06-06 DIAGNOSIS — Z880 Allergy status to penicillin: Secondary | ICD-10-CM | POA: Diagnosis not present

## 2016-08-16 DIAGNOSIS — E1065 Type 1 diabetes mellitus with hyperglycemia: Secondary | ICD-10-CM | POA: Diagnosis not present

## 2016-08-16 DIAGNOSIS — E109 Type 1 diabetes mellitus without complications: Secondary | ICD-10-CM | POA: Diagnosis not present

## 2016-09-21 DIAGNOSIS — R946 Abnormal results of thyroid function studies: Secondary | ICD-10-CM | POA: Diagnosis not present

## 2016-09-21 DIAGNOSIS — Z880 Allergy status to penicillin: Secondary | ICD-10-CM | POA: Diagnosis not present

## 2016-09-21 DIAGNOSIS — E10649 Type 1 diabetes mellitus with hypoglycemia without coma: Secondary | ICD-10-CM | POA: Diagnosis not present

## 2016-09-21 DIAGNOSIS — Z6821 Body mass index (BMI) 21.0-21.9, adult: Secondary | ICD-10-CM | POA: Diagnosis not present

## 2016-09-21 DIAGNOSIS — Z87891 Personal history of nicotine dependence: Secondary | ICD-10-CM | POA: Diagnosis not present

## 2016-09-21 LAB — HEMOGLOBIN A1C: Hemoglobin A1C: 7.4

## 2016-09-22 ENCOUNTER — Encounter: Payer: Self-pay | Admitting: *Deleted

## 2016-10-11 DIAGNOSIS — E109 Type 1 diabetes mellitus without complications: Secondary | ICD-10-CM | POA: Diagnosis not present

## 2016-10-11 DIAGNOSIS — E1065 Type 1 diabetes mellitus with hyperglycemia: Secondary | ICD-10-CM | POA: Diagnosis not present

## 2016-10-21 ENCOUNTER — Telehealth: Payer: Self-pay | Admitting: Internal Medicine

## 2016-10-21 NOTE — Telephone Encounter (Signed)
Pt is in [place] and will schedule something when she gets back in town

## 2017-01-02 DIAGNOSIS — E1065 Type 1 diabetes mellitus with hyperglycemia: Secondary | ICD-10-CM | POA: Diagnosis not present

## 2017-01-02 DIAGNOSIS — E109 Type 1 diabetes mellitus without complications: Secondary | ICD-10-CM | POA: Diagnosis not present

## 2017-03-03 DIAGNOSIS — R7989 Other specified abnormal findings of blood chemistry: Secondary | ICD-10-CM | POA: Diagnosis not present

## 2017-03-03 DIAGNOSIS — E10649 Type 1 diabetes mellitus with hypoglycemia without coma: Secondary | ICD-10-CM | POA: Diagnosis not present

## 2017-03-03 DIAGNOSIS — Z23 Encounter for immunization: Secondary | ICD-10-CM | POA: Diagnosis not present

## 2017-03-03 DIAGNOSIS — Z801 Family history of malignant neoplasm of trachea, bronchus and lung: Secondary | ICD-10-CM | POA: Diagnosis not present

## 2017-03-03 DIAGNOSIS — Z794 Long term (current) use of insulin: Secondary | ICD-10-CM | POA: Diagnosis not present

## 2017-03-03 DIAGNOSIS — Z880 Allergy status to penicillin: Secondary | ICD-10-CM | POA: Diagnosis not present

## 2017-03-03 DIAGNOSIS — Z6821 Body mass index (BMI) 21.0-21.9, adult: Secondary | ICD-10-CM | POA: Diagnosis not present

## 2017-03-03 DIAGNOSIS — Z9641 Presence of insulin pump (external) (internal): Secondary | ICD-10-CM | POA: Diagnosis not present

## 2017-03-03 DIAGNOSIS — Z8719 Personal history of other diseases of the digestive system: Secondary | ICD-10-CM | POA: Diagnosis not present

## 2017-03-03 DIAGNOSIS — M21619 Bunion of unspecified foot: Secondary | ICD-10-CM | POA: Diagnosis not present

## 2017-03-03 DIAGNOSIS — Z87891 Personal history of nicotine dependence: Secondary | ICD-10-CM | POA: Diagnosis not present

## 2017-04-03 DIAGNOSIS — E1065 Type 1 diabetes mellitus with hyperglycemia: Secondary | ICD-10-CM | POA: Diagnosis not present

## 2017-04-03 DIAGNOSIS — E109 Type 1 diabetes mellitus without complications: Secondary | ICD-10-CM | POA: Diagnosis not present

## 2017-05-09 DIAGNOSIS — E103291 Type 1 diabetes mellitus with mild nonproliferative diabetic retinopathy without macular edema, right eye: Secondary | ICD-10-CM | POA: Diagnosis not present

## 2017-05-09 DIAGNOSIS — H52203 Unspecified astigmatism, bilateral: Secondary | ICD-10-CM | POA: Diagnosis not present

## 2017-05-09 LAB — HM DIABETES EYE EXAM

## 2017-06-14 DIAGNOSIS — Z1231 Encounter for screening mammogram for malignant neoplasm of breast: Secondary | ICD-10-CM | POA: Diagnosis not present

## 2017-06-14 LAB — HM MAMMOGRAPHY

## 2017-07-03 DIAGNOSIS — E1065 Type 1 diabetes mellitus with hyperglycemia: Secondary | ICD-10-CM | POA: Diagnosis not present

## 2017-07-26 DIAGNOSIS — Z6820 Body mass index (BMI) 20.0-20.9, adult: Secondary | ICD-10-CM | POA: Diagnosis not present

## 2017-07-26 DIAGNOSIS — E10649 Type 1 diabetes mellitus with hypoglycemia without coma: Secondary | ICD-10-CM | POA: Diagnosis not present

## 2017-07-26 DIAGNOSIS — R7989 Other specified abnormal findings of blood chemistry: Secondary | ICD-10-CM | POA: Diagnosis not present

## 2017-10-03 DIAGNOSIS — E1065 Type 1 diabetes mellitus with hyperglycemia: Secondary | ICD-10-CM | POA: Diagnosis not present

## 2017-10-03 DIAGNOSIS — E109 Type 1 diabetes mellitus without complications: Secondary | ICD-10-CM | POA: Diagnosis not present

## 2017-10-24 ENCOUNTER — Ambulatory Visit: Payer: BLUE CROSS/BLUE SHIELD | Admitting: Medical

## 2017-10-24 ENCOUNTER — Encounter: Payer: Self-pay | Admitting: Medical

## 2017-10-24 ENCOUNTER — Ambulatory Visit (INDEPENDENT_AMBULATORY_CARE_PROVIDER_SITE_OTHER): Payer: BLUE CROSS/BLUE SHIELD | Admitting: Medical

## 2017-10-24 VITALS — BP 100/60 | HR 94 | Temp 98.0°F | Resp 16 | Ht 64.5 in | Wt 119.6 lb

## 2017-10-24 DIAGNOSIS — R6883 Chills (without fever): Secondary | ICD-10-CM

## 2017-10-24 DIAGNOSIS — R1030 Lower abdominal pain, unspecified: Secondary | ICD-10-CM

## 2017-10-24 DIAGNOSIS — M545 Low back pain, unspecified: Secondary | ICD-10-CM

## 2017-10-24 DIAGNOSIS — R350 Frequency of micturition: Secondary | ICD-10-CM

## 2017-10-24 DIAGNOSIS — M791 Myalgia, unspecified site: Secondary | ICD-10-CM | POA: Diagnosis not present

## 2017-10-24 LAB — POCT URINALYSIS DIP (PROADVANTAGE DEVICE)
Bilirubin, UA: NEGATIVE
Glucose, UA: 500 mg/dL — AB
Leukocytes, UA: NEGATIVE
Nitrite, UA: NEGATIVE
Protein Ur, POC: NEGATIVE mg/dL
SPECIFIC GRAVITY, URINE: 1.01
UUROB: NEGATIVE
pH, UA: 6.5 (ref 5.0–8.0)

## 2017-10-24 LAB — CBC WITH DIFFERENTIAL/PLATELET
BASOS: 0 %
Basophils Absolute: 0 10*3/uL (ref 0.0–0.2)
EOS (ABSOLUTE): 0.1 10*3/uL (ref 0.0–0.4)
Eos: 1 %
Hematocrit: 43 % (ref 34.0–46.6)
Hemoglobin: 14.3 g/dL (ref 11.1–15.9)
Immature Grans (Abs): 0 10*3/uL (ref 0.0–0.1)
Immature Granulocytes: 0 %
LYMPHS ABS: 1.1 10*3/uL (ref 0.7–3.1)
Lymphs: 9 %
MCH: 31.5 pg (ref 26.6–33.0)
MCHC: 33.3 g/dL (ref 31.5–35.7)
MCV: 95 fL (ref 79–97)
Monocytes Absolute: 0.9 10*3/uL (ref 0.1–0.9)
Monocytes: 7 %
NEUTROS ABS: 10.3 10*3/uL — AB (ref 1.4–7.0)
Neutrophils: 83 %
PLATELETS: 418 10*3/uL (ref 150–450)
RBC: 4.54 x10E6/uL (ref 3.77–5.28)
RDW: 12.9 % (ref 12.3–15.4)
WBC: 12.5 10*3/uL — ABNORMAL HIGH (ref 3.4–10.8)

## 2017-10-24 LAB — COMPREHENSIVE METABOLIC PANEL
A/G RATIO: 1.1 — AB (ref 1.2–2.2)
ALT: 7 IU/L (ref 0–32)
AST: 7 IU/L (ref 0–40)
Albumin: 3.3 g/dL — ABNORMAL LOW (ref 3.5–5.5)
Alkaline Phosphatase: 87 IU/L (ref 39–117)
BILIRUBIN TOTAL: 0.4 mg/dL (ref 0.0–1.2)
BUN/Creatinine Ratio: 20 (ref 9–23)
BUN: 22 mg/dL (ref 6–24)
CHLORIDE: 98 mmol/L (ref 96–106)
CO2: 24 mmol/L (ref 20–29)
Calcium: 9.2 mg/dL (ref 8.7–10.2)
Creatinine, Ser: 1.08 mg/dL — ABNORMAL HIGH (ref 0.57–1.00)
GFR calc non Af Amer: 60 mL/min/{1.73_m2} (ref >59)
GFR, EST AFRICAN AMERICAN: 69 mL/min/{1.73_m2} (ref >59)
GLOBULIN, TOTAL: 2.9 g/dL (ref 1.5–4.5)
GLUCOSE: 134 mg/dL — AB (ref 65–99)
POTASSIUM: 4.6 mmol/L (ref 3.5–5.2)
SODIUM: 140 mmol/L (ref 134–144)
TOTAL PROTEIN: 6.2 g/dL (ref 6.0–8.5)

## 2017-10-24 LAB — CK: CK TOTAL: 41 U/L (ref 24–173)

## 2017-10-24 NOTE — Progress Notes (Signed)
Subjective: Chief Complaint  Patient presents with  . sick    chills, head pain, neck, back, abdomin pain   Here for pains in neck, skull, back , abdomen.   Started 2 weeks ago.   Hiked throughout [place] and [place] this summer with heavy backpack.   Spends months out in [place] where they have a house.   After the summer got "rolfed"  Uses alternative medicine.   Gets rolf therapy from time to time.     Recently had a therapy session, The next day after the therapy was achy throughout.   Took Advil.  After Advil wore off, had cold chills, nausea, and pain throughout her body.  Has had these persistent same symptoms for 13 days now.   Blood sugars were in the 200s right after the therapy, has improved on this the last week.  Saw acupuncturist yesterday..   Was advised if symptoms persistent to go to PCP.  Has felt chills, not sure about fever though.   acupuncturist yesterday said she felt warm.  She also took 4 Advil this morning. Has some urinary frequency, but urgency, no odor, no blood in urine.   Drinks a lot of water, but this has increased in frequency. No blood in stool.   Bowel movements are always a little weird, fluctuates from constipation to diarrhea.   Has had some lower abdominal pain.   Has hx/o ovarian cysts.  Has burning sensation in lower abdomen and lower back.  No history of rhabdomyolysis.   Last year her insulin pump quit working and was hospitalized for ketoacidosis.  [facility]  No other aggravating or relieving factors. No other complaint.   Past Medical History:  Diagnosis Date  . Diabetes mellitus type 1 (HCC) 2000   Dr. Roberto Scales [facility])  . Migraines    with pregnancy  . Pure hypercholesterolemia    Current Outpatient Medications on File Prior to Visit  Medication Sig Dispense Refill  . ALPRAZolam (XANAX) 0.5 MG tablet Take 0.5-1 tablets (0.25-0.5 mg total) by mouth 3 (three) times daily as needed for anxiety (for flying).  20 tablet 0  . Canagliflozin (INVOKANA PO) Take by mouth.    Marland Kitchen ibuprofen (ADVIL,MOTRIN) 200 MG tablet Take 800 mg by mouth every 8 (eight) hours as needed for headache or cramping (pain).    . insulin aspart (NOVOLOG) 100 UNIT/ML injection Inject into the skin 3 (three) times daily before meals.    Marland Kitchen levothyroxine (SYNTHROID, LEVOTHROID) 50 MCG tablet Take 25 mcg by mouth daily before breakfast.     . Multiple Vitamins-Minerals (ALIVE WOMENS 50+ PO) Take 1 tablet by mouth daily.    . Multiple Vitamins-Minerals (MULTIVITAMIN WITH MINERALS) tablet Take 1 tablet by mouth daily.    . Probiotic Product (PROBIOTIC DAILY PO) Take 1 tablet by mouth daily.     No current facility-administered medications on file prior to visit.    ROS as in subjective    Objective: BP 100/60   Pulse 94   Temp 98 F (36.7 C) (Oral)   Resp 16   Ht 5' 4.5" (1.638 m)   Wt 119 lb 9.6 oz (54.3 kg)   SpO2 98%   BMI 20.21 kg/m   BP Readings from Last 3 Encounters:  10/24/17 100/60  12/24/15 120/80  12/19/15 (!) 106/56   Wt Readings from Last 3 Encounters:  10/24/17 119 lb 9.6 oz (54.3 kg)  12/24/15 128 lb 6.4 oz (58.2 kg)  12/18/15 121  lb 11.1 oz (55.2 kg)     General appearance: alert, no distress, WD/WN, lean white female HEENT: normocephalic, sclerae anicteric, PERRLA, EOMi, nares patent, no discharge or erythema, pharynx normal Oral cavity: MMM, no lesions Neck: supple, no lymphadenopathy, no thyromegaly, no masses Heart: RRR, normal S1, S2, no murmurs Lungs: CTA bilaterally, no wheezes, rhonchi, or rales Abdomen: +bs, soft, mild generalized tenderness, non distended, no masses, no hepatomegaly, no splenomegaly Back: mild paraspinal lumbar tenderness, no midline tenderness Musculoskeletal: non tender, no swelling, no obvious deformity, normal ROM Extremities: no edema, no cyanosis, no clubbing Pulses: 2+ symmetric, upper and lower extremities, normal cap refill Neurological: alert, oriented x 3,  CN2-12 intact, strength normal upper extremities and lower extremities, sensation normal throughout, DTRs 2+ throughout, no cerebellar signs, gait normal Psychiatric: normal affect, behavior normal, pleasant    Assessment: Encounter Diagnoses  Name Primary?  . Myalgia Yes  . Chills   . Lower abdominal pain   . Acute bilateral low back pain without sciatica   . Urinary frequency      Plan: Discussed possible causes.   She has type 1 diabetes, thyroid disease, hx/o metabolic acidosis.   Discussed possibly of rhabdomyolysis, dehydration, metabolic acidosis, thyroid dysfunction or other.   She may have even had one of the issues that is trying to resolve as she does feel some improvement compared to 2 weeks ago.   hydrate well, avoid exercise today until we call back with labs, avoid alcohol for now.  F/u pending labs  Of note, her endocrinologist is who she has been seeing primarily q 4 months.  encouraged yearly physical here with her PCP  Briana Lopez was seen today for sick.  Diagnoses and all orders for this visit:  Myalgia -     Cancel: Comprehensive metabolic panel -     Cancel: CBC with Differential/Platelet -     TSH -     Cancel: CK -     T4, free -     POCT Urinalysis DIP (Proadvantage Device) -     Comprehensive metabolic panel -     CBC with Differential/Platelet -     CK -     Urine Culture  Chills -     Cancel: Comprehensive metabolic panel -     Cancel: CBC with Differential/Platelet -     TSH -     Cancel: CK -     T4, free -     POCT Urinalysis DIP (Proadvantage Device) -     Comprehensive metabolic panel -     CBC with Differential/Platelet -     CK -     Urine Culture  Lower abdominal pain -     Cancel: Comprehensive metabolic panel -     Cancel: CBC with Differential/Platelet -     TSH -     Cancel: CK -     T4, free -     POCT Urinalysis DIP (Proadvantage Device) -     Comprehensive metabolic panel -     CBC with Differential/Platelet -     CK -      Urine Culture  Acute bilateral low back pain without sciatica -     Cancel: Comprehensive metabolic panel -     Cancel: CBC with Differential/Platelet -     TSH -     Cancel: CK -     T4, free -     Comprehensive metabolic panel -     CBC with Differential/Platelet -  CK -     Urine Culture  Urinary frequency

## 2017-10-25 LAB — T4, FREE: Free T4: 1.26 ng/dL (ref 0.82–1.77)

## 2017-10-25 LAB — TSH: TSH: 1.71 u[IU]/mL (ref 0.450–4.500)

## 2017-10-26 ENCOUNTER — Other Ambulatory Visit: Payer: Self-pay | Admitting: Medical

## 2017-10-26 LAB — URINE CULTURE

## 2017-10-26 MED ORDER — SULFAMETHOXAZOLE-TRIMETHOPRIM 800-160 MG PO TABS: 1.0000 | ORAL_TABLET | Freq: Two times a day (BID) | ORAL | 0 refills | Status: DC

## 2017-11-01 DIAGNOSIS — E109 Type 1 diabetes mellitus without complications: Secondary | ICD-10-CM | POA: Diagnosis not present

## 2017-11-01 DIAGNOSIS — E1065 Type 1 diabetes mellitus with hyperglycemia: Secondary | ICD-10-CM | POA: Diagnosis not present

## 2017-11-02 ENCOUNTER — Other Ambulatory Visit: Payer: Self-pay | Admitting: *Deleted

## 2017-11-02 ENCOUNTER — Telehealth: Payer: Self-pay | Admitting: Family Medicine

## 2017-11-02 DIAGNOSIS — Z1211 Encounter for screening for malignant neoplasm of colon: Secondary | ICD-10-CM

## 2017-11-02 NOTE — Telephone Encounter (Signed)
She has [place], may not even really need [place] to do referral. Okay with me, see f she has a preference for where to go, fine for referral for routine colon cancer screening.

## 2017-11-02 NOTE — Telephone Encounter (Signed)
Pt called and is wanting to know if she can go ahead and have her colonoscopy scheduled she has a cpe scheduled  for December, but states she has already meet her deducible for the year and would like to go ahead and have it done if possible. And any other test that needs to be done, she does not want to wait until her CPE to discuss this  Pt can be reached at  [phone]

## 2017-11-02 NOTE — Telephone Encounter (Signed)
Patient said [facility] will be fine, referral done.

## 2017-11-15 ENCOUNTER — Other Ambulatory Visit: Payer: Self-pay | Admitting: Medical

## 2017-11-15 ENCOUNTER — Telehealth: Payer: Self-pay | Admitting: Family Medicine

## 2017-11-15 MED ORDER — CIPROFLOXACIN HCL 500 MG PO TABS: 500.0000 mg | ORAL_TABLET | Freq: Two times a day (BID) | ORAL | 0 refills | Status: DC

## 2017-11-15 NOTE — Telephone Encounter (Signed)
Patient stated she was feeling completely better until 2 days ago. Her symptoms came back including the urine odor and kidney soreness. Please advise.

## 2017-11-15 NOTE — Telephone Encounter (Signed)
How much improvement has she had if she could put a percent on it?  What current symptoms?  I would have expected her to be back to normal by now.  Consider recheck depending on symptoms and reply.

## 2017-11-15 NOTE — Telephone Encounter (Signed)
I sent Cipro BID x 3 days.   If this doesn't resolve symptoms, then recheck

## 2017-11-15 NOTE — Telephone Encounter (Signed)
Pt called and states that she is still having pain in her kidneys and the smell in her urine, she states she took all of the antibiotic that was given, and is wondering if she can have another round or if she needs to come back in,pt uses [place] [place] And pt can be reached at [phone]

## 2017-11-15 NOTE — Telephone Encounter (Signed)
Forwarding to you :)

## 2017-11-16 NOTE — Telephone Encounter (Signed)
Patient informed. 

## 2017-11-17 ENCOUNTER — Encounter: Payer: Self-pay | Admitting: Gastroenterology

## 2017-12-14 ENCOUNTER — Other Ambulatory Visit: Payer: Self-pay

## 2017-12-14 ENCOUNTER — Ambulatory Visit (AMBULATORY_SURGERY_CENTER): Payer: Self-pay

## 2017-12-14 ENCOUNTER — Encounter: Payer: Self-pay | Admitting: Gastroenterology

## 2017-12-14 ENCOUNTER — Telehealth: Payer: Self-pay

## 2017-12-14 VITALS — Ht 64.5 in | Wt 122.0 lb

## 2017-12-14 DIAGNOSIS — Z1211 Encounter for screening for malignant neoplasm of colon: Secondary | ICD-10-CM

## 2017-12-14 MED ORDER — NA SULFATE-K SULFATE-MG SULF 17.5-3.13-1.6 GM/177ML PO SOLN: 1.0000 | Freq: Once | ORAL | 0 refills | Status: AC

## 2017-12-14 NOTE — Progress Notes (Signed)
No egg or soy allergy known to patient  Patient has never been put to sleep or sedated,or had any procedures or surgeries. No diet pills per patient No home 02 use per patient  No blood thinners per patient  Pt denies issues with constipation  No A fib or A flutter  EMMI video sent to pt's e mail

## 2017-12-14 NOTE — Telephone Encounter (Signed)
Pt came in today for a previst. Pt is on insulin pump. We need instructions on how to manage. Pt diabetic doctor is Dr. Roberto Scales. Pt is having procedure Thursday 12-28-17.

## 2017-12-15 NOTE — Telephone Encounter (Signed)
Briana Lopez 1966/01/10 [id number]  Dear Dr. :Roberto Scales   Dr Harl Bowie has scheduled the above patient for a colonoscopy  at [facility] on 12/28/2017.  Our records show that he/she is on insulin therapy via an insulin pump.  Our colonoscopy/enoscopy prep protocol requires that:  the patient must be on a clear liquid diet the entire day prior to the procedure date as well as the morning of the procedure  the patient must be NPO for 2 hours prior to the procedure   the patient must consume a PEG 3350 solution to prepare for the procedure.  Please advise [place] of any adjustments that need to be made to the patient's insulin pump therapy prior to the above procedure date.    Please route or fax back this completed form to me at [phone] .  If you have any question, please call me at [phone].  Thank you for your help with this matter.  Sincerely,  Genella Mech CMA

## 2017-12-16 DIAGNOSIS — Z23 Encounter for immunization: Secondary | ICD-10-CM | POA: Diagnosis not present

## 2017-12-16 DIAGNOSIS — H00022 Hordeolum internum right lower eyelid: Secondary | ICD-10-CM | POA: Diagnosis not present

## 2017-12-25 NOTE — Telephone Encounter (Signed)
Called and left message with pt regarding instructions for insulin pump. Have not heard back from Dr. Roberto Scales.

## 2017-12-26 ENCOUNTER — Telehealth: Payer: Self-pay | Admitting: Gastroenterology

## 2017-12-26 NOTE — Telephone Encounter (Signed)
Pt returned nurse's call regarding her endocrinologist phone number. Pt stated that she sees Dr. Prudencio Burly and his phone is [phone].Marland Kitchen

## 2017-12-26 NOTE — Telephone Encounter (Signed)
Briana Lopez did send pump note to Dr Roberto Scales- LM for pt to let us know if she has received instructions for her pump for her colonoscopy from Dr Marcelline Deist' office   Briana Lopez

## 2017-12-27 DIAGNOSIS — F411 Generalized anxiety disorder: Secondary | ICD-10-CM | POA: Diagnosis not present

## 2017-12-27 DIAGNOSIS — F430 Acute stress reaction: Secondary | ICD-10-CM | POA: Diagnosis not present

## 2017-12-27 DIAGNOSIS — R7989 Other specified abnormal findings of blood chemistry: Secondary | ICD-10-CM | POA: Diagnosis not present

## 2017-12-27 DIAGNOSIS — E10649 Type 1 diabetes mellitus with hypoglycemia without coma: Secondary | ICD-10-CM | POA: Diagnosis not present

## 2017-12-27 DIAGNOSIS — Z6820 Body mass index (BMI) 20.0-20.9, adult: Secondary | ICD-10-CM | POA: Diagnosis not present

## 2017-12-27 LAB — HEMOGLOBIN A1C: Hemoglobin A1C: 6.7

## 2017-12-27 NOTE — Telephone Encounter (Signed)
Spoke with patient and she was recommended by Dr Roberto Scales to leave on pump during procedure and decrease basil rate, Patient aware of all instructions and she sees Dr Roberto Scales today and will have him fax over the actual instructions so we can scan in her chart

## 2017-12-28 ENCOUNTER — Ambulatory Visit (AMBULATORY_SURGERY_CENTER): Payer: BLUE CROSS/BLUE SHIELD | Admitting: Gastroenterology

## 2017-12-28 ENCOUNTER — Encounter: Payer: Self-pay | Admitting: Gastroenterology

## 2017-12-28 VITALS — BP 110/72 | HR 57 | Temp 97.3°F | Resp 10

## 2017-12-28 DIAGNOSIS — Z1211 Encounter for screening for malignant neoplasm of colon: Secondary | ICD-10-CM | POA: Diagnosis not present

## 2017-12-28 DIAGNOSIS — D125 Benign neoplasm of sigmoid colon: Secondary | ICD-10-CM

## 2017-12-28 DIAGNOSIS — K635 Polyp of colon: Secondary | ICD-10-CM | POA: Diagnosis not present

## 2017-12-28 DIAGNOSIS — D124 Benign neoplasm of descending colon: Secondary | ICD-10-CM | POA: Diagnosis not present

## 2017-12-28 MED ORDER — SODIUM CHLORIDE 0.9 % IV SOLN: 500.0000 mL | Freq: Once | INTRAVENOUS | Status: DC

## 2017-12-28 NOTE — Progress Notes (Signed)
Called to room to assist during endoscopic procedure.  Patient ID and intended procedure confirmed with present staff. Received instructions for my participation in the procedure from the performing physician.  

## 2017-12-28 NOTE — Progress Notes (Signed)
CBG was 48 upon adm to recovery room.  Re-checked and it was 54.  According to the patient's monitor, the CBG read 63.  Patient did refuse any supplement as far as D5W not D50.  Patient's daughter is crying.  Patient states no symptoms.  No orders per Dr. Silverio Decamp.

## 2017-12-28 NOTE — Progress Notes (Signed)
To PACU, VSS. Report to [email or web address] 

## 2017-12-28 NOTE — Patient Instructions (Signed)
YOU HAD AN ENDOSCOPIC PROCEDURE TODAY AT [facility]:   Refer to the procedure report that was given to you for any specific questions about what was found during the examination.  If the procedure report does not answer your questions, please call your gastroenterologist to clarify.  If you requested that your care partner not be given the details of your procedure findings, then the procedure report has been included in a sealed envelope for you to review at your convenience later.  YOU SHOULD EXPECT: Some feelings of bloating in the abdomen. Passage of more gas than usual.  Walking can help get rid of the air that was put into your GI tract during the procedure and reduce the bloating. If you had a lower endoscopy (such as a colonoscopy or flexible sigmoidoscopy) you may notice spotting of blood in your stool or on the toilet paper. If you underwent a bowel prep for your procedure, you may not have a normal bowel movement for a few days.  Please Note:  You might notice some irritation and congestion in your nose or some drainage.  This is from the oxygen used during your procedure.  There is no need for concern and it should clear up in a day or so.  SYMPTOMS TO REPORT IMMEDIATELY:   Following lower endoscopy (colonoscopy or flexible sigmoidoscopy):  Excessive amounts of blood in the stool  Significant tenderness or worsening of abdominal pains  Swelling of the abdomen that is new, acute  Fever of 100F or higher   For urgent or emergent issues, a gastroenterologist can be reached at any hour by calling [phone].   DIET:  We do recommend a small meal at first, but then you may proceed to your regular diet.  Drink plenty of fluids but you should avoid alcoholic beverages for 24 hours.  ACTIVITY:  You should plan to take it easy for the rest of today and you should NOT DRIVE or use heavy machinery until tomorrow (because of the sedation medicines used during the test).     FOLLOW UP: Our staff will call the number listed on your records the next business day following your procedure to check on you and address any questions or concerns that you may have regarding the information given to you following your procedure. If we do not reach you, we will leave a message.  However, if you are feeling well and you are not experiencing any problems, there is no need to return our call.  We will assume that you have returned to your regular daily activities without incident.  If any biopsies were taken you will be contacted by phone or by letter within the next 1-3 weeks.  Please call us at [phone] if you have not heard about the biopsies in 3 weeks.    SIGNATURES/CONFIDENTIALITY: You and/or your care partner have signed paperwork which will be entered into your electronic medical record.  These signatures attest to the fact that that the information above on your After Visit Summary has been reviewed and is understood.  Full responsibility of the confidentiality of this discharge information lies with you and/or your care-partner.  Be sure to eat when you get home.  Read all of the handouts given to you by your recovery room nurse.

## 2017-12-28 NOTE — Op Note (Addendum)
[facility] Patient Name: Briana Lopez Procedure Date: 12/28/2017 11:27 AM MRN: [id number] Endoscopist: Mauri Pole , MD Age: 52 Referring MD:  Date of Birth: 24-Mar-1965 Gender: Female Account #: [id number] Procedure:                Colonoscopy Indications:              Screening for colorectal malignant neoplasm Medicines:                Monitored Anesthesia Care Procedure:                Pre-Anesthesia Assessment:                           - Prior to the procedure, a History and Physical                            was performed, and patient medications and                            allergies were reviewed. The patient's tolerance of                            previous anesthesia was also reviewed. The risks                            and benefits of the procedure and the sedation                            options and risks were discussed with the patient.                            All questions were answered, and informed consent                            was obtained. Prior Anticoagulants: The patient has                            taken no previous anticoagulant or antiplatelet                            agents. ASA Grade Assessment: II - A patient with                            mild systemic disease. After reviewing the risks                            and benefits, the patient was deemed in                            satisfactory condition to undergo the procedure.                           After obtaining informed consent, the colonoscope  was passed under direct vision. Throughout the                            procedure, the patient's blood pressure, pulse, and                            oxygen saturations were monitored continuously. The                            Colonoscope was introduced through the anus and                            advanced to the the cecum, identified by                            appendiceal orifice  and ileocecal valve. The                            colonoscopy was performed without difficulty. The                            patient tolerated the procedure well. The quality                            of the bowel preparation was excellent. The                            ileocecal valve, appendiceal orifice, and rectum                            were photographed. Scope In: 11:36:41 AM Scope Out: 12:08:06 PM Total Procedure Duration: 0 hours 31 minutes 25 seconds  Findings:                 The perianal and digital rectal examinations were                            normal.                           A 1 mm polyp was found in the descending colon. The                            polyp was sessile. The polyp was removed with a                            cold biopsy forceps. Resection and retrieval were                            complete.                           A 4 mm polyp was found in the sigmoid colon. The  polyp was sessile. The polyp was removed with a                            cold snare. Resection and retrieval were complete.                           Non-bleeding internal hemorrhoids were found during                            retroflexion. The hemorrhoids were small. Complications:            No immediate complications. Estimated Blood Loss:     Estimated blood loss was minimal. Impression:               - One 1 mm polyp in the descending colon, removed                            with a cold biopsy forceps. Resected and retrieved.                           - One 4 mm polyp in the sigmoid colon, removed with                            a cold snare. Resected and retrieved.                           - Non-bleeding internal hemorrhoids. Recommendation:           - Patient has a contact number available for                            emergencies. The signs and symptoms of potential                            delayed complications were discussed with  the                            patient. Return to normal activities tomorrow.                            Written discharge instructions were provided to the                            patient.                           - Resume previous diet.                           - Continue present medications.                           - Await pathology results.                           - Repeat colonoscopy in 5-10 years for surveillance  based on pathology results. Mauri Pole, MD 12/28/2017 12:14:44 PM This report has been signed electronically.

## 2017-12-28 NOTE — Progress Notes (Signed)
Pt's states no medical or surgical changes since previsit or office visit. 

## 2017-12-29 ENCOUNTER — Telehealth: Payer: Self-pay | Admitting: *Deleted

## 2017-12-29 NOTE — Telephone Encounter (Signed)
  Follow up Call-  Call back number 12/28/2017  Post procedure Call Back phone  # [phone]  Permission to leave phone message Yes  Some recent data might be hidden     Patient questions:  Do you have a fever, pain , or abdominal swelling? No. Pain Score  0 *  Have you tolerated food without any problems? Yes.    Have you been able to return to your normal activities? Yes.    Do you have any questions about your discharge instructions: Diet   No. Medications  No. Follow up visit  No.  Do you have questions or concerns about your Care? No.  Actions: * If pain score is 4 or above: No action needed, pain <4.

## 2018-01-03 DIAGNOSIS — Z794 Long term (current) use of insulin: Secondary | ICD-10-CM | POA: Diagnosis not present

## 2018-01-09 ENCOUNTER — Encounter: Payer: Self-pay | Admitting: Gastroenterology

## 2018-01-24 DIAGNOSIS — E109 Type 1 diabetes mellitus without complications: Secondary | ICD-10-CM | POA: Diagnosis not present

## 2018-01-24 DIAGNOSIS — E1065 Type 1 diabetes mellitus with hyperglycemia: Secondary | ICD-10-CM | POA: Diagnosis not present

## 2018-01-24 NOTE — Progress Notes (Signed)
Chief Complaint  Patient presents with  . Annual Exam    fasting (blood in lab and gave urine) annual exam with pap. Sees eye doctor [facility]) every February. Thinks she may be hitting menopause. Sometimes has a yellow vaginal discharge that has an odor that is different than usual.     Briana Lopez is a 52 y.o. female who presents for a complete physical.  She has the following concerns:  Periods are starting to get irregular (skipped a month, and then lasted x 2 weeks).  Last period was right before her colonoscopy (end of October/early November).  She isn't having hot flashes, some sweats at night. She started taking CBD capsules at bedtime, which helps her sleep.  Yellow, odorous discharge noticed twice in the past, not currently. Once it was yellowish, to almost green, lasted about a week. The second time it lasted only a day.  Last noticed it Thanksgiving. Currently denies any discharge; never had associated pelvic pain.  Patient hasn't been seen by me since 2016. She last saw Audelia Acton in September with muscular complaints. She ultimately was diagnosed with UTI, treated with Bactrim.  CBC and c-met were done (WBC elevated, Cr mildly elevated, see below). She currently denies any urinary complaints.   She continues to see her endocrinologist regularly for her diabetes care and hypothyroidism.  She was last seen in November.  Care Everywhere results/notes were reviewed. She had diabetic eye exam 05/09/17 (mild retinopathy noted R eye, per endo's notes). Labs 12/27/17: TSH 1.790, A1c 6.7, fglu 113  Last CBC, chem, urine microalbumin and lipids were on 09/21/16.   Lipids: TC 184, TG 62, HDL 80, LDL 92, chol/HDL 2.3.  She is not on statin. When asked why, she reports she has refused.  She takes enough medications/pills and didn't want more.  She is concerned about side effects.  She is doing intermittent fasting program.  Needed to drop her basal rate during the day. Had hypoglycemia initially, but  not recently.  She reports she is eating more food and more carbs than she ever used to.  She remains very active: 2 days High intensity cardio (HIIT or Peloton workout) 3x/week cardio with strength training 2 days of yoga or walking Walks if sugars are up (or Peloton)  Immunization History  Administered Date(s) Administered  . Hepatitis A, Adult 05/16/2014  . Influenza Split 01/06/2016, 12/16/2017  . Influenza, Seasonal, Injecte, Preservative Fre 11/08/2010  . Influenza,inj,Quad PF,6+ Mos 09/25/2014, 01/06/2016, 03/03/2017  . Pneumococcal Polysaccharide-23 03/03/2003  . Tdap 04/05/2010, 05/16/2014  . Typhoid Inactivated 05/16/2014   Last Pap smear: 09/2014, normal, no high risk HPV Last mammogram: 06/2017 Last colonoscopy: 12/2017, hyperplastic polyps Last DEXA: never Dentist: 4 times/yr Ophtho: yearly, last 05/09/17, showing mild retinopathy in R eye Exercise: 2 days High intensity cardio (HIIT or Peloton workout); 3x/week cardio with strength training; 2 days of yoga or walking  Normal vitamin D level in 07/2011  Past Medical History:  Diagnosis Date  . Anxiety   . Diabetes mellitus type 1 (HCC) 2000   Dr[facility] Roberto Scales [facility])  . Migraines    with pregnancy  . Pure hypercholesterolemia   . Thyroid disease     History reviewed. No pertinent surgical history.  Social History   Socioeconomic History  . Marital status: Divorced    Spouse name: Not on file  . Number of children: 3  . Years of education: Not on file  . Highest education level: Not on file  Occupational History  .  Occupation: hair stylist  Social Needs  . Financial resource strain: Not on file  . Food insecurity:    Worry: Not on file    Inability: Not on file  . Transportation needs:    Medical: Not on file    Non-medical: Not on file  Tobacco Use  . Smoking status: Current Some Day Smoker    Types: Cigarettes  . Smokeless tobacco: Never Used  . Tobacco comment: 52yr old -52yrs old, quit at  age 52 has an occasional cigarette when out with friends  Substance and Sexual Activity  . Alcohol use: Yes    Alcohol/week: 0.0 standard drinks    Comment: Sun through Wed none, but 3 glasses on the other nights  . Drug use: No  . Sexual activity: Yes    Partners: Male    Comment: husband had vasectomy  Lifestyle  . Physical activity:    Days per week: Not on file    Minutes per session: Not on file  . Stress: Not on file  Relationships  . Social connections:    Talks on phone: Not on file    Gets together: Not on file    Attends religious service: Not on file    Active member of club or organization: Not on file    Attends meetings of clubs or organizations: Not on file    Relationship status: Not on file  . Intimate partner violence:    Fear of current or ex partner: Not on file    Emotionally abused: Not on file    Physically abused: Not on file    Forced sexual activity: Not on file  Other Topics Concern  . Not on file  Social History Narrative   Divorced.  Re-married. Lives with husband, dog, cat. 3 children (daughter at [employer] [place]   01/2018    Family History  Problem Relation Age of Onset  . Cancer Father        lung cancer  . Hypertension Father   . Alcohol abuse Brother   . Cancer Maternal Grandmother        colon cancer in 891's . Clotting disorder Maternal Grandmother   . Cancer Paternal Grandmother        breast cancer in her 756's . Heart disease Paternal Grandmother   . Diabetes Neg Hx   . Esophageal cancer Neg Hx   . Rectal cancer Neg Hx   . Stomach cancer Neg Hx     Outpatient Encounter Medications as of 01/25/2018  Medication Sig Note  . acetone, urine, test (RELION KETONE) strip Check every day in AM   . Canagliflozin (INVOKANA PO) Take by mouth daily.    . Cannabidiol 100 MG/ML SOLN Take by mouth.    .Marland Kitchenglucose blood (FREESTYLE LITE) test strip 1 each by Other route Six (6) times a day.    .Marland Kitchenglucose blood (PRECISION QID TEST) test strip Check 6 times a day   . insulin aspart (NOVOLOG) 100 UNIT/ML injection Inject into the skin 3 (three) times daily before meals. 12/18/2015: Insulin pump...continuous   . insulin lispro (HUMALOG KWIKPEN) 100 UNIT/ML KwikPen Inject into the skin. 01/25/2018: In case needed if pump stops working  . Insulin Syringe-Needle U-100 (RELION INSULIN SYRINGE) 31G X 15/64" 0.3 ML MISC 4-5 per day   . Lancets MISC 1 each by Other route Six (6) times a day.   . levothyroxine (SYNTHROID, LEVOTHROID) 50 MCG  tablet Take 25 mcg by mouth daily before breakfast.    . ALPRAZolam (XANAX) 0.5 MG tablet Take 0.5-1 tablets (0.25-0.5 mg total) by mouth 3 (three) times daily as needed for anxiety (for flying). (Patient not taking: Reported on 12/14/2017)   . Enteral Nutrition Supplies (COMPAT CONTAINER/PUMP SET) MISC Frequency:Q3D   Dosage:0.0     Instructions:  Note:Dose: NO   . glucagon (GLUCAGON EMERGENCY) 1 MG injection Inject into the muscle.   . ibuprofen (ADVIL,MOTRIN) 200 MG tablet Take 800 mg by mouth every 8 (eight) hours as needed for headache or cramping (pain).   . Insulin Detemir (LEVEMIR FLEXTOUCH) 100 UNIT/ML Pen 9 units twice a day. May increase as needed to keep fasting glucose between 70 and 130. Max dose 40 units daily 01/25/2018: For emergencies if pump fails  . [DISCONTINUED] Continuous Blood Gluc Sensor ([place]) MISC by Other route every fourteen (14) days. Libre    No facility-administered encounter medications on file as of 01/25/2018.    Also taking the following supplements: Turmeric, vitamin E, Vitamin D3, and omega (takes her husband's, may also include CoQ10)  Allergies  Allergen Reactions  . Penicillins     As a child  Has patient had a PCN reaction causing immediate rash, facial/tongue/throat swelling, SOB or lightheadedness with hypotension: unknown Has patient had a PCN reaction causing severe rash involving mucus  membranes or skin necrosis: unknown Has patient had a PCN reaction that required hospitalization: unknown Has patient had a PCN reaction occurring within the last 10 years: no If all of the above answers are "NO", then may proceed with Cephalosporin use.    ROS: The patient denies anorexia, fever, weight changes, headaches, vision changes (some blurred vision when her sugars are high; uses reading glasses), decreased hearing, ear pain, sore throat, breast concerns, chest pain, palpitations, dizziness, syncope, dyspnea on exertion, cough, swelling, nausea, vomiting, abdominal pain, melena, hematochezia, indigestion/heartburn, hematuria, incontinence, dysuria, genital lesions, vaginal dryness,joint pains; no numbness, tingling, weakness, tremor, suspicious skin lesions, depression, anxiety, abnormal bleeding/bruising, or enlarged lymph nodes. Bowels are normal. No black/bloody/mucus in stools Husband says she snores. She denies unrefreshed sleep or daytime somnolence Slight decreased libido.  Irregular menses and some mild night sweats, per HPI. Rare sharp pain in feet, no other neuropathy. Rough year--lost brother-in-law at 27 (suddenly, postoperatively from a septoplasty surgery), also lost her poodle of 12 years, she got married, and her daughter is having trouble adjusting.  Overall coping well, but feeling a little more down than usual just recently.   PHYSICAL EXAM:  BP 112/60   Pulse 60   Ht '5\' 5"'  (1.651 m)   Wt 121 lb 3.2 oz (55 kg)   LMP 12/08/2017 (Approximate)   BMI 20.17 kg/m   Wt Readings from Last 3 Encounters:  01/25/18 121 lb 3.2 oz (55 kg)  12/14/17 122 lb (55.3 kg)  10/24/17 119 lb 9.6 oz (54.3 kg)   General Appearance:   Alert, cooperative, no distress, appears stated age  Head:   Normocephalic, without obvious abnormality, atraumatic  Eyes:   PERRL, conjunctiva/corneas clear, EOM's intact, fundi benign.   Ears:   Normal TM's and external ear canals   Nose:  Nares normal, mucosa normal, no drainage or sinus tenderness  Throat:  Lips, mucosa, and tongue normal; teeth and gums normal  Neck:  Supple, no lymphadenopathy; thyroid: noenlargement/ tenderness/nodules; no carotid bruit or JVD  Back:  Spine nontender, no curvature, ROM normal, no CVA tenderness  Lungs:  Clear to auscultation bilaterally without wheezes, rales or ronchi; respirations unlabored  Chest Wall:   No tenderness or deformity  Heart:   Regular rate and rhythm, S1 and S2 normal, no murmur, rub  or gallop  Breast Exam:   No tenderness, masses, or nipple discharge or inversion.No axillary lymphadenopathy. Examination of her right breast is limited due to presence of her pump at the inner upper quadrant of the right breast  Abdomen:   Soft, non-tender, nondistended, normoactive bowel sounds,   no masses, no hepatosplenomegaly  Genitalia:   Normal external genitalia without lesions. BUS and vagina normal; no cervical motion tenderness.  No abnormal vaginal discharge. Uterus and adnexa not enlarged, nontender, no masses. Pap not performed  Rectal:   declined (no complaints, had recent colonoscopy)  Extremities:  No clubbing, cyanosis or edema  Pulses:  2+ and symmetric all extremities  Skin:  Skin color, texture, turgor normal, no rashes or lesions  Lymph nodes:  Cervical, supraclavicular, and axillary nodes normal  Neurologic:  CNII-XII intact, normal strength, sensation and gait; reflexes 2+ and symmetric throughout   Psych: Normal mood, affect, hygiene and grooming.Became slightly tearful in discussing the loss of her dog and brother-in-law, but exhibited full range of affect.  Normal diabetic foot exam.  Urine dip: glucose (on invokana), negative ketones, leuks, blood, protein    Chemistry      Component Value Date/Time   NA 140 10/24/2017 0842   K 4.6 10/24/2017 0842    CL 98 10/24/2017 0842   CO2 24 10/24/2017 0842   BUN 22 10/24/2017 0842   CREATININE 1.08 (H) 10/24/2017 0842   CREATININE 0.85 12/24/2015 1253      Component Value Date/Time   CALCIUM 9.2 10/24/2017 0842   ALKPHOS 87 10/24/2017 0842   AST 7 10/24/2017 0842   ALT 7 10/24/2017 0842   BILITOT 0.4 10/24/2017 0842     Lab Results  Component Value Date   WBC 12.5 (H) 10/24/2017   HGB 14.3 10/24/2017   HCT 43.0 10/24/2017   MCV 95 10/24/2017   PLT 418 10/24/2017    ASSESSMENT/PLAN:  Annual physical exam - Plan: POCT Urinalysis DIP (Proadvantage Device), Lipid panel, Comprehensive metabolic panel, CBC with Differential/Platelet  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 - Plan: Lipid panel, Comprehensive metabolic panel, CBC with Differential/Platelet, Microalbumin / creatinine urine ratio  Immunization due - Plan: Pneumococcal polysaccharide vaccine 23-valent greater than or equal to 2yo subcutaneous/IM  Menopausal symptoms - counseled re: perimenopause. Discussed OTC supplements, and expectations  Hypothyroidism, unspecified type - adequately replaced per recent TSH per endo  Type 1 diabetes mellitus with hypoglycemia and without coma ([place]) - Plan: rosuvastatin (CRESTOR) 5 MG tablet   Pap not needed (due 2021, q5 years per guidelines)  c-met, lipids, urine microalb due, repeat urine dip. Discussed briefly ACEI/ARB--endo doesn't have her on, BP is good, pt hesitant to take add'l meds. Will check urine for microalb.  Pushing starting statin today, rather than ARB.  Counseled extensively re: increased CV risks due to her diabetes, and the need for statin even though her lipids are very good.  Discussed risks and potential side effects, and strongly encouraged her to try Crestor.  Can take  CoQ10 if has any myalgias.  She agrees to have Rx sent, but reports she may not start it right  away.  Understands the needs to have labs drawn in 2-3 months after starting (so 3 months sent to pharm). Strongly encouraged her to take this. She occasionally smokes a cigarette (used to smoke more)--encouraged to stop entirely, due to CV risks.   Discussed monthly self breast exams and yearly mammograms; at least 30 minutes of aerobic activity at least 5 days/week, weight bearing exercise at least 2x/wk; proper sunscreen use reviewed; healthy diet, including goals of calcium and vitamin D intake and alcohol recommendations (less than or equal to 1 drink/day--she was encouraged to cut back) reviewed; regular seatbelt use; changing batteries in smoke detectors, carbon monoxide detectors. Immunization recommendations discussed--continue yearly flu shots. Shingrix recommended--risks/SE reviewed, to check with insurance and schedule [place] (vs get at [facility] if cash-pay). Pneumonia vaccine also recommended, given pneumovax booster today. Colonoscopy recommendations reviewed--recent, UTD. Not due for 10 years.  Total visit time was 60 min, significant counseling as documented, separate from physical, related to her diabetes risks.      I also recommend that you take a statin daily for prevention of cardiovascular disease.  Crestor is usually the best tolerated (everybody is different though).  I recommended taking it once daily (at bedtime).  If you develop any muscle aches, try adding coenzyme Q10 daily , as this often makes this side effect less.  Let us know if/when you start the medication, that you are tolerating it. We will need to schedule another fasting lab test within 2-3 month of starting it.  I encourage you to cut back on your wine intake--this can affect your liver and your sleep.  Up to 1 alcoholic beverage daily is the recommendation.  Please try and avoid ALL smoking.

## 2018-01-25 ENCOUNTER — Encounter: Payer: Self-pay | Admitting: Family Medicine

## 2018-01-25 ENCOUNTER — Ambulatory Visit: Payer: BLUE CROSS/BLUE SHIELD | Admitting: Family Medicine

## 2018-01-25 VITALS — BP 112/60 | HR 60 | Ht 65.0 in | Wt 121.2 lb

## 2018-01-25 DIAGNOSIS — E039 Hypothyroidism, unspecified: Secondary | ICD-10-CM | POA: Diagnosis not present

## 2018-01-25 DIAGNOSIS — Z23 Encounter for immunization: Secondary | ICD-10-CM

## 2018-01-25 DIAGNOSIS — Z0000 Encounter for general adult medical examination without abnormal findings: Secondary | ICD-10-CM

## 2018-01-25 DIAGNOSIS — E10319 Type 1 diabetes mellitus with unspecified diabetic retinopathy without macular edema: Secondary | ICD-10-CM

## 2018-01-25 DIAGNOSIS — E10649 Type 1 diabetes mellitus with hypoglycemia without coma: Secondary | ICD-10-CM

## 2018-01-25 DIAGNOSIS — N951 Menopausal and female climacteric states: Secondary | ICD-10-CM | POA: Diagnosis not present

## 2018-01-25 LAB — POCT URINALYSIS DIP (PROADVANTAGE DEVICE)
BILIRUBIN UA: NEGATIVE
BILIRUBIN UA: NEGATIVE mg/dL
Leukocytes, UA: NEGATIVE
NITRITE UA: NEGATIVE
Protein Ur, POC: NEGATIVE mg/dL
RBC UA: NEGATIVE
Specific Gravity, Urine: 1.015
Urobilinogen, Ur: NEGATIVE
pH, UA: 5.5 (ref 5.0–8.0)

## 2018-01-25 MED ORDER — ROSUVASTATIN CALCIUM 5 MG PO TABS: 5.0000 mg | ORAL_TABLET | Freq: Every day | ORAL | 0 refills | Status: DC

## 2018-01-25 NOTE — Patient Instructions (Addendum)
  HEALTH MAINTENANCE RECOMMENDATIONS:  It is recommended that you get at least 30 minutes of aerobic exercise at least 5 days/week (for weight loss, you may need as much as 60-90 minutes). This can be any activity that gets your heart rate up. This can be divided in 10-15 minute intervals if needed, but try and build up your endurance at least once a week.  Weight bearing exercise is also recommended twice weekly.  Eat a healthy diet with lots of vegetables, fruits and fiber.  "Colorful" foods have a lot of vitamins (ie green vegetables, tomatoes, red peppers, etc).  Limit sweet tea, regular sodas and alcoholic beverages, all of which has a lot of calories and sugar.  Up to 1 alcoholic drink daily may be beneficial for women (unless trying to lose weight, watch sugars).  Drink a lot of water.  Calcium recommendations are 1200-1500 mg daily (1500 mg for postmenopausal women or women without ovaries), and vitamin D 1000 IU daily.  This should be obtained from diet and/or supplements (vitamins), and calcium should not be taken all at once, but in divided doses.  Monthly self breast exams and yearly mammograms for women over the age of 44 is recommended.  Sunscreen of at least SPF 30 should be used on all sun-exposed parts of the skin when outside between the hours of 10 am and 4 pm (not just when at beach or pool, but even with exercise, golf, tennis, and yard work!)  Use a sunscreen that says "broad spectrum" so it covers both UVA and UVB rays, and make sure to reapply every 1-2 hours.  Remember to change the batteries in your smoke detectors when changing your clock times in the spring and fall. I recommend carbon monoxide detectors for your home.  Use your seat belt every time you are in a car, and please drive safely and not be distracted with cell phones and texting while driving.  Estroven (black cohosh, soy) may be helpful for menopausal symptoms.  I recommend getting the new shingles vaccine  (Shingrix). You will need to check with your insurance to see if it is covered, and if covered, contact our office to schedule a nurse visit.  It is a series of 2 injections, spaced 2 months apart.   I also recommend that you take a statin daily for prevention of cardiovascular disease.  Crestor is usually the best tolerated (everybody is different though).  I recommended taking it once daily (at bedtime).  If you develop any muscle aches, try adding coenzyme Q10 daily , as this often makes this side effect less.  Let us know if/when you start the medication, that you are tolerating it. We will need to schedule another fasting lab test within 2-3 month of starting it.  I encourage you to cut back on your wine intake--this can affect your liver and your sleep.  Up to 1 alcoholic beverage daily is the recommendation.  Please try and avoid ALL smoking.

## 2018-01-26 LAB — LIPID PANEL
CHOL/HDL RATIO: 2.4 ratio (ref 0.0–4.4)
Cholesterol, Total: 253 mg/dL — ABNORMAL HIGH (ref 100–199)
HDL: 105 mg/dL (ref >39)
LDL CALC: 136 mg/dL — AB (ref 0–99)
TRIGLYCERIDES: 60 mg/dL (ref 0–149)
VLDL Cholesterol Cal: 12 mg/dL (ref 5–40)

## 2018-01-26 LAB — CBC WITH DIFFERENTIAL/PLATELET
Basophils Absolute: 0 10*3/uL (ref 0.0–0.2)
Basos: 1 %
EOS (ABSOLUTE): 0.1 10*3/uL (ref 0.0–0.4)
Eos: 4 %
Hematocrit: 47.7 % — ABNORMAL HIGH (ref 34.0–46.6)
Hemoglobin: 15.3 g/dL (ref 11.1–15.9)
IMMATURE GRANULOCYTES: 0 %
Immature Grans (Abs): 0 10*3/uL (ref 0.0–0.1)
Lymphocytes Absolute: 1.6 10*3/uL (ref 0.7–3.1)
Lymphs: 45 %
MCH: 30.1 pg (ref 26.6–33.0)
MCHC: 32.1 g/dL (ref 31.5–35.7)
MCV: 94 fL (ref 79–97)
MONOS ABS: 0.4 10*3/uL (ref 0.1–0.9)
Monocytes: 11 %
NEUTROS PCT: 39 %
Neutrophils Absolute: 1.4 10*3/uL (ref 1.4–7.0)
PLATELETS: 325 10*3/uL (ref 150–450)
RBC: 5.09 x10E6/uL (ref 3.77–5.28)
RDW: 12.7 % (ref 12.3–15.4)
WBC: 3.6 10*3/uL (ref 3.4–10.8)

## 2018-01-26 LAB — COMPREHENSIVE METABOLIC PANEL
ALK PHOS: 58 IU/L (ref 39–117)
ALT: 25 IU/L (ref 0–32)
AST: 23 IU/L (ref 0–40)
Albumin/Globulin Ratio: 1.9 (ref 1.2–2.2)
Albumin: 4.4 g/dL (ref 3.5–5.5)
BUN/Creatinine Ratio: 19 (ref 9–23)
BUN: 16 mg/dL (ref 6–24)
Bilirubin Total: 0.4 mg/dL (ref 0.0–1.2)
CO2: 28 mmol/L (ref 20–29)
Calcium: 9.9 mg/dL (ref 8.7–10.2)
Chloride: 98 mmol/L (ref 96–106)
Creatinine, Ser: 0.85 mg/dL (ref 0.57–1.00)
GFR calc Af Amer: 92 mL/min/{1.73_m2} (ref >59)
GFR calc non Af Amer: 80 mL/min/{1.73_m2} (ref >59)
GLOBULIN, TOTAL: 2.3 g/dL (ref 1.5–4.5)
Glucose: 105 mg/dL — ABNORMAL HIGH (ref 65–99)
POTASSIUM: 4.4 mmol/L (ref 3.5–5.2)
SODIUM: 140 mmol/L (ref 134–144)
Total Protein: 6.7 g/dL (ref 6.0–8.5)

## 2018-01-26 LAB — MICROALBUMIN / CREATININE URINE RATIO
Creatinine, Urine: 54.7 mg/dL
Microalbumin, Urine: <3 ug/mL

## 2018-04-11 ENCOUNTER — Other Ambulatory Visit: Payer: BLUE CROSS/BLUE SHIELD

## 2018-04-13 ENCOUNTER — Other Ambulatory Visit: Payer: BLUE CROSS/BLUE SHIELD

## 2018-04-13 DIAGNOSIS — Z79899 Other long term (current) drug therapy: Secondary | ICD-10-CM | POA: Diagnosis not present

## 2018-04-13 DIAGNOSIS — E7800 Pure hypercholesterolemia, unspecified: Secondary | ICD-10-CM | POA: Diagnosis not present

## 2018-04-14 LAB — LIPID PANEL
Chol/HDL Ratio: 2.3 ratio (ref 0.0–4.4)
Cholesterol, Total: 167 mg/dL (ref 100–199)
HDL: 73 mg/dL (ref >39)
LDL Calculated: 79 mg/dL (ref 0–99)
Triglycerides: 76 mg/dL (ref 0–149)
VLDL Cholesterol Cal: 15 mg/dL (ref 5–40)

## 2018-04-16 ENCOUNTER — Other Ambulatory Visit: Payer: Self-pay | Admitting: *Deleted

## 2018-04-16 DIAGNOSIS — E10649 Type 1 diabetes mellitus with hypoglycemia without coma: Secondary | ICD-10-CM

## 2018-04-16 MED ORDER — ROSUVASTATIN CALCIUM 5 MG PO TABS: 5.0000 mg | ORAL_TABLET | Freq: Every day | ORAL | 1 refills | Status: DC

## 2018-04-24 DIAGNOSIS — E109 Type 1 diabetes mellitus without complications: Secondary | ICD-10-CM | POA: Diagnosis not present

## 2018-04-24 DIAGNOSIS — E1065 Type 1 diabetes mellitus with hyperglycemia: Secondary | ICD-10-CM | POA: Diagnosis not present

## 2018-04-24 DIAGNOSIS — Z794 Long term (current) use of insulin: Secondary | ICD-10-CM | POA: Diagnosis not present

## 2018-04-24 LAB — HEPATIC FUNCTION PANEL
ALT: 17 IU/L (ref 0–32)
AST: 15 IU/L (ref 0–40)
Albumin: 3.7 g/dL — ABNORMAL LOW (ref 3.8–4.9)
Alkaline Phosphatase: 65 IU/L (ref 39–117)
BILIRUBIN, DIRECT: 0.06 mg/dL (ref 0.00–0.40)
Bilirubin Total: <0.2 mg/dL (ref 0.0–1.2)
Total Protein: 5.9 g/dL — ABNORMAL LOW (ref 6.0–8.5)

## 2018-04-24 LAB — SPECIMEN STATUS REPORT

## 2018-06-19 NOTE — Telephone Encounter (Signed)
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  You can reach me through [place], my home institution. That is where I see Ms Tosh

## 2018-07-16 ENCOUNTER — Telehealth: Payer: Self-pay | Admitting: Family Medicine

## 2018-07-16 NOTE — Telephone Encounter (Signed)
Spoke to patient and informed her of provider message. Patient stated she will call her dentist and get an ointment because she is good for it.

## 2018-07-16 NOTE — Telephone Encounter (Signed)
Will not rx without evaluation.  She can either check with her eye doctor (she is a diabetic so should have one), or needs virtual visit here.

## 2018-07-16 NOTE — Telephone Encounter (Signed)
Pharmacy sent new RX for erythromycin 0.5% eye ointment use as directed for stye   States that pt has a stye  Please send to [facility] on [address] [phone]

## 2018-07-23 DIAGNOSIS — Z794 Long term (current) use of insulin: Secondary | ICD-10-CM | POA: Diagnosis not present

## 2018-07-23 DIAGNOSIS — E109 Type 1 diabetes mellitus without complications: Secondary | ICD-10-CM | POA: Diagnosis not present

## 2018-07-23 DIAGNOSIS — E1065 Type 1 diabetes mellitus with hyperglycemia: Secondary | ICD-10-CM | POA: Diagnosis not present

## 2018-10-19 DIAGNOSIS — Z1231 Encounter for screening mammogram for malignant neoplasm of breast: Secondary | ICD-10-CM | POA: Diagnosis not present

## 2018-10-19 LAB — HM MAMMOGRAPHY

## 2018-10-23 DIAGNOSIS — E109 Type 1 diabetes mellitus without complications: Secondary | ICD-10-CM | POA: Diagnosis not present

## 2018-10-23 DIAGNOSIS — E1065 Type 1 diabetes mellitus with hyperglycemia: Secondary | ICD-10-CM | POA: Diagnosis not present

## 2018-10-25 ENCOUNTER — Encounter: Payer: Self-pay | Admitting: Family Medicine

## 2018-11-05 ENCOUNTER — Other Ambulatory Visit: Payer: Self-pay | Admitting: Family Medicine

## 2018-11-05 DIAGNOSIS — E10649 Type 1 diabetes mellitus with hypoglycemia without coma: Secondary | ICD-10-CM

## 2018-11-05 NOTE — Telephone Encounter (Signed)
Pt has an appt in dec

## 2018-11-07 DIAGNOSIS — Z23 Encounter for immunization: Secondary | ICD-10-CM | POA: Diagnosis not present

## 2018-11-07 DIAGNOSIS — H00012 Hordeolum externum right lower eyelid: Secondary | ICD-10-CM | POA: Diagnosis not present

## 2018-11-08 ENCOUNTER — Encounter: Payer: Self-pay | Admitting: *Deleted

## 2018-12-20 DIAGNOSIS — E1065 Type 1 diabetes mellitus with hyperglycemia: Secondary | ICD-10-CM | POA: Diagnosis not present

## 2019-01-14 DIAGNOSIS — R7989 Other specified abnormal findings of blood chemistry: Secondary | ICD-10-CM | POA: Diagnosis not present

## 2019-01-14 DIAGNOSIS — M21619 Bunion of unspecified foot: Secondary | ICD-10-CM | POA: Diagnosis not present

## 2019-01-14 DIAGNOSIS — E10649 Type 1 diabetes mellitus with hypoglycemia without coma: Secondary | ICD-10-CM | POA: Diagnosis not present

## 2019-01-14 LAB — HEMOGLOBIN A1C: Hemoglobin A1C: 6.5

## 2019-02-03 NOTE — Patient Instructions (Addendum)
HEALTH MAINTENANCE RECOMMENDATIONS:  It is recommended that you get at least 30 minutes of aerobic exercise at least 5 days/week (for weight loss, you may need as much as 60-90 minutes). This can be any activity that gets your heart rate up. This can be divided in 10-15 minute intervals if needed, but try and build up your endurance at least once a week.  Weight bearing exercise is also recommended twice weekly.  Eat a healthy diet with lots of vegetables, fruits and fiber.  "Colorful" foods have a lot of vitamins (ie green vegetables, tomatoes, red peppers, etc).  Limit sweet tea, regular sodas and alcoholic beverages, all of which has a lot of calories and sugar.  Up to 1 alcoholic drink daily may be beneficial for women (unless trying to lose weight, watch sugars).  Drink a lot of water.  Calcium recommendations are 1200-1500 mg daily (1500 mg for postmenopausal women or women without ovaries), and vitamin D 1000 IU daily.  This should be obtained from diet and/or supplements (vitamins), and calcium should not be taken all at once, but in divided doses.  Monthly self breast exams and yearly mammograms for women over the age of 19 is recommended.  Sunscreen of at least SPF 30 should be used on all sun-exposed parts of the skin when outside between the hours of 10 am and 4 pm (not just when at beach or pool, but even with exercise, golf, tennis, and yard work!)  Use a sunscreen that says "broad spectrum" so it covers both UVA and UVB rays, and make sure to reapply every 1-2 hours.  Remember to change the batteries in your smoke detectors when changing your clock times in the spring and fall. Carbon monoxide detectors are recommended for your home.  Use your seat belt every time you are in a car, and please drive safely and not be distracted with cell phones and texting while driving.  We are checking for elevated microalbumin in the urine.  If elevated, we will need to start an additional  medication to help protect the kidneys, as discussed.  I recommend getting the new shingles vaccine (Shingrix). You will need to check with your insurance to see if it is covered, and if covered, schedule a nurse visit when convenient.  It is a series of 2 injections, spaced 2 months apart. COVID vaccine is recommended when available.  Call your eye doctor to see if you are due for your yearly eye exam (it may have been cancelled due to [place]). Please be sure that they send me a copy of the report, as well as Dr. Roberto Scales.  Avoid dairy for 7-10 days. If your diarrhea isn't improving in the next few days, submit the stool samples.   Food Choices to Help Relieve Diarrhea, Adult When you have diarrhea, the foods you eat and your eating habits are very important. Choosing the right foods and drinks can help:  Relieve diarrhea.  Replace lost fluids and nutrients.  Prevent dehydration. What general guidelines should I follow?  Relieving diarrhea  Choose foods with less than 2 g or .07 oz. of fiber per serving.  Limit fats to less than 8 tsp (38 g or 1.34 oz.) a day.  Avoid the following: ? Foods and beverages sweetened with high-fructose corn syrup, honey, or sugar alcohols such as xylitol, sorbitol, and mannitol. ? Foods that contain a lot of fat or sugar. ? Fried, greasy, or spicy foods. ? High-fiber grains, breads, and cereals. ? Raw  fruits and vegetables.  Eat foods that are rich in probiotics. These foods include dairy products such as yogurt and fermented milk products. They help increase healthy bacteria in the stomach and intestines (gastrointestinal tract, or GI tract).  If you have lactose intolerance, avoid dairy products. These may make your diarrhea worse.  Take medicine to help stop diarrhea (antidiarrheal medicine) only as told by your health care provider. Replacing nutrients  Eat small meals or snacks every 3-4 hours.  Eat bland foods, such as white rice, toast, or  baked potato, until your diarrhea starts to get better. Gradually reintroduce nutrient-rich foods as tolerated or as told by your health care provider. This includes: ? Well-cooked protein foods. ? Peeled, seeded, and soft-cooked fruits and vegetables. ? Low-fat dairy products.  Take vitamin and mineral supplements as told by your health care provider. Preventing dehydration  Start by sipping water or a special solution to prevent dehydration (oral rehydration solution, ORS). Urine that is clear or pale yellow means that you are getting enough fluid.  Try to drink at least 8-10 cups of fluid each day to help replace lost fluids.  You may add other liquids in addition to water, such as clear juice or decaffeinated sports drinks, as tolerated or as told by your health care provider.  Avoid drinks with caffeine, such as coffee, tea, or soft drinks.  Avoid alcohol. What foods are recommended?     The items listed may not be a complete list. Talk with your health care provider about what dietary choices are best for you. Grains White rice. White, [place], or pita breads (fresh or toasted), including plain rolls, buns, or bagels. White pasta. Saltine, soda, or graham crackers. Pretzels. Low-fiber cereal. Cooked cereals made with water (such as cornmeal, farina, or cream cereals). Plain muffins. Matzo. Melba toast. Zwieback. Vegetables Potatoes (without the skin). Most well-cooked and canned vegetables without skins or seeds. Tender lettuce. Fruits Apple sauce. Fruits canned in juice. Cooked apricots, cherries, grapefruit, peaches, pears, or plums. Fresh bananas and cantaloupe. Meats and other protein foods Baked or boiled chicken. Eggs. Tofu. Fish. Seafood. Smooth nut butters. Ground or well-cooked tender beef, ham, veal, lamb, pork, or poultry. Dairy Plain yogurt, kefir, and unsweetened liquid yogurt. Lactose-free milk, buttermilk, skim milk, or soy milk. Low-fat or nonfat hard cheese.  Beverages Water. Low-calorie sports drinks. Fruit juices without pulp. Strained tomato and vegetable juices. Decaffeinated teas. Sugar-free beverages not sweetened with sugar alcohols. Oral rehydration solutions, if approved by your health care provider. Seasoning and other foods Bouillon, broth, or soups made from recommended foods. What foods are not recommended? The items listed may not be a complete list. Talk with your health care provider about what dietary choices are best for you. Grains Whole grain, whole wheat, bran, or rye breads, rolls, pastas, and crackers. Wild or brown rice. Whole grain or bran cereals. Barley. Oats and oatmeal. Corn tortillas or taco shells. Granola. Popcorn. Vegetables Raw vegetables. Fried vegetables. Cabbage, broccoli, Brussels sprouts, artichokes, baked beans, beet greens, corn, kale, legumes, peas, sweet potatoes, and yams. Potato skins. Cooked spinach and cabbage. Fruits Dried fruit, including raisins and dates. Raw fruits. Stewed or dried prunes. Canned fruits with syrup. Meat and other protein foods Fried or fatty meats. Deli meats. Chunky nut butters. Nuts and seeds. Beans and lentils. Berniece Salines. Hot dogs. Sausage. Dairy High-fat cheeses. Whole milk, chocolate milk, and beverages made with milk, such as milk shakes. Half-and-half. Cream. sour cream. Ice cream. Beverages Caffeinated beverages (such as  coffee, tea, soda, or energy drinks). Alcoholic beverages. Fruit juices with pulp. Prune juice. Soft drinks sweetened with high-fructose corn syrup or sugar alcohols. High-calorie sports drinks. Fats and oils Butter. Cream sauces. Margarine. Salad oils. Plain salad dressings. Olives. Avocados. Mayonnaise. Sweets and desserts Sweet rolls, doughnuts, and sweet breads. Sugar-free desserts sweetened with sugar alcohols such as xylitol and sorbitol. Seasoning and other foods Honey. Hot sauce. Chili powder. Gravy. Cream-based or milk-based soups. Pancakes and  waffles. Summary  When you have diarrhea, the foods you eat and your eating habits are very important.  Make sure you get at least 8-10 cups of fluid each day, or enough to keep your urine clear or pale yellow.  Eat bland foods and gradually reintroduce healthy, nutrient-rich foods as tolerated, or as told by your health care provider.  Avoid high-fiber, fried, greasy, or spicy foods. This information is not intended to replace advice given to you by your health care provider. Make sure you discuss any questions you have with your health care provider. Document Released: 04/16/2003 Document Revised: 05/17/2018 Document Reviewed: 01/22/2016 Elsevier Patient Education  [place].  Diarrhea, Adult Diarrhea is frequent loose and watery bowel movements. Diarrhea can make you feel weak and cause you to become dehydrated. Dehydration can make you tired and thirsty, cause you to have a dry mouth, and decrease how often you urinate. Diarrhea typically lasts 2-3 days. However, it can last longer if it is a sign of something more serious. It is important to treat your diarrhea as told by your health care provider. Follow these instructions at home: Eating and drinking     Follow these recommendations as told by your health care provider:  Take an oral rehydration solution (ORS). This is an over-the-counter medicine that helps return your body to its normal balance of nutrients and water. It is found at pharmacies and retail stores.  Drink plenty of fluids, such as water, ice chips, diluted fruit juice, and low-calorie sports drinks. You can drink milk also, if desired.  Avoid drinking fluids that contain a lot of sugar or caffeine, such as energy drinks, sports drinks, and soda.  Eat bland, easy-to-digest foods in small amounts as you are able. These foods include bananas, applesauce, rice, lean meats, toast, and crackers.  Avoid alcohol.  Avoid spicy or fatty foods.  Medicines   Take over-the-counter and prescription medicines only as told by your health care provider.  If you were prescribed an antibiotic medicine, take it as told by your health care provider. Do not stop using the antibiotic even if you start to feel better. General instructions   Wash your hands often using soap and water. If soap and water are not available, use a hand sanitizer. Others in the household should wash their hands as well. Hands should be washed: ? After using the toilet or changing a diaper. ? Before preparing, cooking, or serving food. ? While caring for a sick person or while visiting someone in a hospital.  Drink enough fluid to keep your urine pale yellow.  Rest at home while you recover.  Watch your condition for any changes.  Take a warm bath to relieve any burning or pain from frequent diarrhea episodes.  Keep all follow-up visits as told by your health care provider. This is important. Contact a health care provider if:  You have a fever.  Your diarrhea gets worse.  You have new symptoms.  You cannot keep fluids down.  You feel light-headed or  dizzy.  You have a headache.  You have muscle cramps. Get help right away if:  You have chest pain.  You feel extremely weak or you faint.  You have bloody or black stools or stools that look like tar.  You have severe pain, cramping, or bloating in your abdomen.  You have trouble breathing or you are breathing very quickly.  Your heart is beating very quickly.  Your skin feels cold and clammy.  You feel confused.  You have signs of dehydration, such as: ? Dark urine, very little urine, or no urine. ? Cracked lips. ? Dry mouth. ? Sunken eyes. ? Sleepiness. ? Weakness. Summary  Diarrhea is frequent loose and watery bowel movements. Diarrhea can make you feel weak and cause you to become dehydrated.  Drink enough fluids to keep your urine pale yellow.  Make sure that you wash your hands after  using the toilet. If soap and water are not available, use hand sanitizer.  Contact a health care provider if your diarrhea gets worse or you have new symptoms.  Get help right away if you have signs of dehydration. This information is not intended to replace advice given to you by your health care provider. Make sure you discuss any questions you have with your health care provider. Document Released: 01/14/2002 Document Revised: 06/30/2017 Document Reviewed: 06/30/2017 Elsevier Patient Education  2020 [employer].

## 2019-02-03 NOTE — Progress Notes (Signed)
Chief Complaint  Patient presents with  . Annual Exam    fasting annual exam with pap-sees eye doctor for eye exams. Hs had diarrhea since Dec 2nd. Did talk to endo and he deferred to you. Her diet over the last few weeks has been slightly different than usual-more rich foods.     Briana Lopez is a 53 y.o. female who presents for a complete physical.  She has the following concerns:  Diarrhea since 12/2. Woke up in the middle of the night with watery diarrhea after eating dinner out, rich foods (including mac and cheese). Taking Imodium prior to bed helps, is up at night with diarrhea if she doesn't take it.  Didn't take it last night, and has been having diarrhea since 4 am. It'll resolve, but recur about an hour after eating. Has been mostly dairy-free and gluten-free for about a year.  Has been eating more gluten and dairy through the holidays.  Was planning to resume prior diet 1/1. The kids are home, and has a lot of junk in the house. Denies bloating or gas. Never saw blood or mucus. It is usually very watery, though it is more solid now than it had been. Denies abdominal pain (before Christmas she was a little sore in the RLQ, thought it was her ovary, and pain resolved). Never had nausea or vomiting, fever or chills.  She continues to see her endocrinologist regularly for her diabetes care and hypothyroidism.  She was last seen in December.  Care Everywhere results/notes were reviewed. She denies any hypoglycemia. She checks feet regularly without concerns. She had diabetic eye exam 05/09/17 (mild retinopathy noted R eye, per endo's notes). Follow up was cancelled due to [place] and she hasn't rescheduled yet. Labs 01/14/2019: A1c 6.5 Last TSH was 1.790 in 12/2017, doesn't look like it was checked this year by endo. She denies any thyroid-related complaints.  She was started on Crestor 67m last year (after LDL was up to 136). She takes coenzyme Q10. Hasn't had any side effects.  Recheck was  good: Lab Results  Component Value Date   CHOL 167 04/13/2018   HDL 73 04/13/2018   LDLCALC 79 04/13/2018   TRIG 76 04/13/2018   CHOLHDL 2.3 04/13/2018   She called here asking for erythro ointment for a stye in June, was told needed evaluation, got rx called in by her endo.  She also had [facility] visit 9/30 for stye, and also got her flu shot there. She plans to discuss styes with ophtho (getting 2x/year)   Immunization History  Administered Date(s) Administered  . Hepatitis A, Adult 05/16/2014  . Influenza Split 01/06/2016, 12/16/2017  . Influenza, Quadrivalent, Recombinant, Inj, Pf 12/16/2017  . Influenza, Seasonal, Injecte, Preservative Fre 11/08/2010  . Influenza,inj,Quad PF,6+ Mos 09/25/2014, 01/06/2016, 03/03/2017  . Influenza-Unspecified 11/07/2018  . Pneumococcal Polysaccharide-23 03/03/2003, 01/25/2018  . Tdap 04/05/2010, 05/16/2014  . Typhoid Inactivated 05/16/2014   Last Pap smear: 09/2014, normal, no high risk HPV Last mammogram: 10/2018 Last colonoscopy: 12/2017, hyperplastic polyps Last DEXA: never Dentist: 2 times/yr Ophtho: yearly, last 05/09/17, showing mild retinopathy in R eye. [place] Exercise: 2 days High intensity cardio (HIIT or Peloton workout); 3x/week cardio with strength training; 2 days of yoga or walking. Golfing with her husband (walks the course at the beach).  Normal vitamin D level in 07/2011  PM[facility], [place] [facility] [place]  Outpatient Encounter Medications as of 02/04/2019  Medication Sig Note  . acetone,  urine, test (RELION KETONE) strip Check every day in AM   . ALPRAZolam (XANAX) 0.5 MG tablet Take 0.5-1 tablets (0.25-0.5 mg total) by mouth 3 (three) times daily as needed for anxiety (for flying). 02/04/2019: Uses prn flying, and rarely in the middle of the night for sleep (1/2 tablet)  . Canagliflozin (INVOKANA PO) Take by mouth daily.    . Cannabidiol 100 MG/ML SOLN Take by mouth.    .  Cholecalciferol (VITAMIN D3) 1.25 MG (50000 UT) TABS Take 1 tablet by mouth daily.   Marland Kitchen COENZYME Q10 PO Take 1 tablet by mouth daily.   Marland Kitchen glucagon (GLUCAGON EMERGENCY) 1 MG injection Inject into the muscle.   . insulin aspart (NOVOLOG) 100 UNIT/ML injection Inject into the skin 3 (three) times daily before meals. 12/18/2015: Insulin pump...continuous   . Insulin Detemir (LEVEMIR FLEXTOUCH) 100 UNIT/ML Pen 9 units twice a day. May increase as needed to keep fasting glucose between 70 and 130. Max dose 40 units daily 01/25/2018: For emergencies if pump fails  . insulin lispro (HUMALOG KWIKPEN) 100 UNIT/ML KwikPen Inject into the skin. 01/25/2018: In case needed if pump stops working  . Insulin Syringe-Needle U-100 (RELION INSULIN SYRINGE) 31G X 15/64" 0.3 ML MISC 4-5 per day   . Lancets MISC 1 each by Other route Six (6) times a day.   . levothyroxine (SYNTHROID, LEVOTHROID) 50 MCG tablet Take 50 mcg by mouth daily before breakfast.    . loperamide (IMODIUM A-D) 2 MG tablet Take 2 mg by mouth as needed for diarrhea or loose stools. 02/04/2019: Last dose yesterday am.  . rosuvastatin (CRESTOR) 5 MG tablet TAKE 1 TABLET ONCE DAILY.   Marland Kitchen TURMERIC PO Take 1 tablet by mouth daily.   Marland Kitchen VITAMIN E PO Take 2 capsules by mouth daily.   . [DISCONTINUED] glucose blood (PRECISION QID TEST) test strip Check 6 times a day   . ibuprofen (ADVIL,MOTRIN) 200 MG tablet Take 800 mg by mouth every 8 (eight) hours as needed for headache or cramping (pain).   . [DISCONTINUED] Enteral Nutrition Supplies (COMPAT CONTAINER/PUMP SET) MISC Frequency:Q3D   Dosage:0.0     Instructions:  Note:Dose: NO    No facility-administered encounter medications on file as of 02/04/2019.   Allergies  Allergen Reactions  . Penicillins     As a child  Has patient had a PCN reaction causing immediate rash, facial/tongue/throat swelling, SOB or lightheadedness with hypotension: unknown Has patient had a PCN reaction causing severe rash  involving mucus membranes or skin necrosis: unknown Has patient had a PCN reaction that required hospitalization: unknown Has patient had a PCN reaction occurring within the last 10 years: no If all of the above answers are "NO", then may proceed with Cephalosporin use.    ROS: The patient denies anorexia, fever, weight changes, headaches, vision changes, decreased hearing, ear pain, sore throat, breast concerns, chest pain, palpitations, dizziness, syncope, dyspnea on exertion, cough, swelling, nausea, vomiting, abdominal pain, melena, hematochezia, indigestion/heartburn, hematuria, incontinence, dysuria, genital lesions, vaginal dryness,joint pains; no numbness, tingling, weakness, tremor, suspicious skin lesions, depression, anxiety, abnormal bleeding/bruising, or enlarged lymph nodes. Spotting in November and December (no full period, just spotting).  No night sweats or hot flashes. Denies any neuropathy Moods are good. Diarrhea per HPI Insomnia. CBD helps (melatonin gave nightmares) Rarely takes 1/2 xanax in middle of night if she can't sleep  Rare cigarette--only smoked 2 in the last 6 months (with her brother at Thanksgiving)   PHYSICAL EXAM:  BP 128/62  Pulse 64   Temp (!) 97.2 F (36.2 C) (Other (Comment))   Ht _0  (1.651 m)   Wt 126 lb 3.2 oz (57.2 kg)   LMP 01/27/2019   BMI 21.00 kg/m   Wt Readings from Last 3 Encounters:  02/04/19 126 lb 3.2 oz (57.2 kg)  01/25/18 121 lb 3.2 oz (55 kg)  12/14/17 122 lb (55.3 kg)    General Appearance:   Alert, cooperative, no distress, appears stated age  Head:   Normocephalic, without obvious abnormality, atraumatic  Eyes:   PERRL, conjunctiva/corneas clear, EOM's intact, fundi benign.   Ears:   Normal TM's and external ear canals  Nose:  Not examined, wearing mask due to COVID-19 pandemic  Throat:  Not examined, wearing mask due to COVID-19 pandemic  Neck:  Supple, no lymphadenopathy; thyroid:  noenlargement/ tenderness/nodules; no carotid bruit or JVD  Back:  Spine nontender, no curvature, ROM normal, no CVA tenderness  Lungs:   Clear to auscultation bilaterally without wheezes, rales or ronchi; respirations unlabored  Chest Wall:   No tenderness or deformity  Heart:   Regular rate and rhythm, S1 and S2 normal, no murmur, rub  or gallop  Breast Exam:   No tenderness, masses, or nipple discharge or inversion.No axillary lymphadenopathy. Examination of her left breast is partially limited due to presence of her pump at the outer lower quadrant of the left breast  Abdomen:   Soft, non-tender, nondistended, normoactive bowel sounds,   no masses, no hepatosplenomegaly  Genitalia:   Normal external genitalia without lesions. BUS and vagina normal; no cervical lesions or cervical motion tenderness.  No abnormal vaginal discharge. Uterus and adnexa not enlarged, nontender, no masses. Pap performed  Rectal:    Normal sphincter tone, no masses, heme negative light brown stool  Extremities:  No clubbing, cyanosis or edema. Normal monofilament exam  Pulses:  2+ and symmetric all extremities  Skin:  Skin color, texture, turgor normal, no rashes or lesions  Lymph nodes:  Cervical, supraclavicular, and axillary nodes normal  Neurologic:  Normal strength, sensation and gait; reflexes 2+ and symmetric throughout  Psych: Normal mood, affect, hygiene and grooming.  Normal diabetic foot exam.  Urine dip--notable for glucose (in invokana), otherwise normal no leuks or ketones.   ASSESSMENT/PLAN:  Annual physical exam - Plan: POCT Urinalysis DIP (Proadvantage Device), Vitamin D 25 hydroxy, Lipid panel, TSH, Comprehensive metabolic panel, CBC with Differential, Urine Microalbumin w/creat. ratio, Cytology - PAP([place])  Type 1 diabetes mellitus with hypoglycemia and without coma ([place]) - under the care of Dr. Roberto Scales;  well controlled - Plan: Lipid panel, Urine Microalbumin w/creat. ratio, rosuvastatin (CRESTOR) 5 MG tablet  Hypothyroidism, unspecified type - euthyroid by history - Plan: TSH  Medication monitoring encounter - Plan: Vitamin D 25 hydroxy, Lipid panel, TSH, Comprehensive metabolic panel, CBC with Differential, Urine Microalbumin w/creat. ratio  Diarrhea, unspecified type - avoid dairy, BRAT/bland diet. send in stool samples if not improving - Plan: Cdiff NAA+O+P+Stool Culture, Comprehensive metabolic panel, CBC with Differential  Need for hepatitis A immunization - has only had first vaccine; 2nd given today - Plan: Hepatitis A vaccine adult IM  Patient reminded to reschedule her annual diabetic eye exam (past due) 2nd hep A vaccine today.   c-met, lipids, TSH, urine microalbumin, CBC   Discussed briefly ACEI/ARB--[facility] doesn't have her on; BP has been good. Due for urine microalb. Discussed that will need to start if elevated ratio.  Tobacco use--She occasionally smokes a cigarette (used to smoke  more)--encouraged to stop entirely, due to CV risks.   Discussed monthly self breast exams and yearly mammograms; at least 30 minutes of aerobic activity at least 5 days/week, weight bearing exercise at least 2x/wk; proper sunscreen use reviewed; healthy diet, including goals of calcium and vitamin D intake and alcohol recommendations (less than or equal to 1 drink/day--she was encouraged to cut back) reviewed; regular seatbelt use; changing batteries in smoke detectors, carbon monoxide detectors. Immunization recommendations discussed--continue yearly flu shots. Hepatitis A #2 given today. Shingrix recommended--risks/SE reviewed, to check with insurance and schedule [place] (vs get at [facility] if cash-pay, due to her high deductible).  Colonoscopy recommendations reviewed--UTD, due again 12/2027.  COVID vaccine recommended when available  BRAT diet, no dairy Stool studies given to submit if not  better.

## 2019-02-04 ENCOUNTER — Other Ambulatory Visit: Payer: Self-pay

## 2019-02-04 ENCOUNTER — Ambulatory Visit (INDEPENDENT_AMBULATORY_CARE_PROVIDER_SITE_OTHER): Payer: BC Managed Care – PPO | Admitting: Family Medicine

## 2019-02-04 ENCOUNTER — Other Ambulatory Visit (HOSPITAL_COMMUNITY)
Admission: RE | Admit: 2019-02-04 | Discharge: 2019-02-04 | Disposition: A | Discharge status: Home / Self Care | Payer: BC Managed Care – PPO | Source: Ambulatory Visit | Attending: Family Medicine | Admitting: Family Medicine

## 2019-02-04 ENCOUNTER — Encounter: Payer: Self-pay | Admitting: Family Medicine

## 2019-02-04 VITALS — BP 128/62 | HR 64 | Temp 97.2°F | Ht 65.0 in | Wt 126.2 lb

## 2019-02-04 DIAGNOSIS — E109 Type 1 diabetes mellitus without complications: Secondary | ICD-10-CM | POA: Diagnosis not present

## 2019-02-04 DIAGNOSIS — R197 Diarrhea, unspecified: Secondary | ICD-10-CM | POA: Diagnosis not present

## 2019-02-04 DIAGNOSIS — Z0000 Encounter for general adult medical examination without abnormal findings: Secondary | ICD-10-CM | POA: Diagnosis not present

## 2019-02-04 DIAGNOSIS — E1065 Type 1 diabetes mellitus with hyperglycemia: Secondary | ICD-10-CM | POA: Diagnosis not present

## 2019-02-04 DIAGNOSIS — E039 Hypothyroidism, unspecified: Secondary | ICD-10-CM | POA: Diagnosis not present

## 2019-02-04 DIAGNOSIS — Z23 Encounter for immunization: Secondary | ICD-10-CM

## 2019-02-04 DIAGNOSIS — Z5181 Encounter for therapeutic drug level monitoring: Secondary | ICD-10-CM

## 2019-02-04 DIAGNOSIS — Z794 Long term (current) use of insulin: Secondary | ICD-10-CM | POA: Diagnosis not present

## 2019-02-04 DIAGNOSIS — E10649 Type 1 diabetes mellitus with hypoglycemia without coma: Secondary | ICD-10-CM

## 2019-02-04 LAB — POCT URINALYSIS DIP (PROADVANTAGE DEVICE)
Bilirubin, UA: NEGATIVE
Blood, UA: NEGATIVE
Glucose, UA: 500 mg/dL — AB
Ketones, POC UA: NEGATIVE mg/dL
Leukocytes, UA: NEGATIVE
Nitrite, UA: NEGATIVE
Protein Ur, POC: NEGATIVE mg/dL
Specific Gravity, Urine: 1.015
Urobilinogen, Ur: NEGATIVE
pH, UA: 6 (ref 5.0–8.0)

## 2019-02-04 MED ORDER — ROSUVASTATIN CALCIUM 5 MG PO TABS: 5.0000 mg | ORAL_TABLET | Freq: Every day | ORAL | 3 refills | Status: DC

## 2019-02-05 ENCOUNTER — Other Ambulatory Visit: Payer: Self-pay | Admitting: Internal Medicine

## 2019-02-05 ENCOUNTER — Other Ambulatory Visit: Payer: BC Managed Care – PPO

## 2019-02-05 DIAGNOSIS — R197 Diarrhea, unspecified: Secondary | ICD-10-CM

## 2019-02-05 LAB — CBC WITH DIFFERENTIAL/PLATELET
Basophils Absolute: 0 10*3/uL (ref 0.0–0.2)
Basos: 1 %
EOS (ABSOLUTE): 0.2 10*3/uL (ref 0.0–0.4)
Eos: 4 %
Hematocrit: 45.2 % (ref 34.0–46.6)
Hemoglobin: 14.8 g/dL (ref 11.1–15.9)
Immature Grans (Abs): 0 10*3/uL (ref 0.0–0.1)
Immature Granulocytes: 0 %
Lymphocytes Absolute: 0.9 10*3/uL (ref 0.7–3.1)
Lymphs: 21 %
MCH: 29.5 pg (ref 26.6–33.0)
MCHC: 32.7 g/dL (ref 31.5–35.7)
MCV: 90 fL (ref 79–97)
Monocytes Absolute: 0.5 10*3/uL (ref 0.1–0.9)
Monocytes: 11 %
Neutrophils Absolute: 2.7 10*3/uL (ref 1.4–7.0)
Neutrophils: 63 %
Platelets: 295 10*3/uL (ref 150–450)
RBC: 5.01 x10E6/uL (ref 3.77–5.28)
RDW: 12 % (ref 11.7–15.4)
WBC: 4.3 10*3/uL (ref 3.4–10.8)

## 2019-02-05 LAB — TSH: TSH: 1.47 u[IU]/mL (ref 0.450–4.500)

## 2019-02-05 LAB — COMPREHENSIVE METABOLIC PANEL
ALT: 59 IU/L — ABNORMAL HIGH (ref 0–32)
AST: 23 IU/L (ref 0–40)
Albumin/Globulin Ratio: 2 (ref 1.2–2.2)
Albumin: 4.2 g/dL (ref 3.8–4.9)
Alkaline Phosphatase: 60 IU/L (ref 39–117)
BUN/Creatinine Ratio: 14 (ref 9–23)
BUN: 13 mg/dL (ref 6–24)
Bilirubin Total: 0.6 mg/dL (ref 0.0–1.2)
CO2: 24 mmol/L (ref 20–29)
Calcium: 9.3 mg/dL (ref 8.7–10.2)
Chloride: 100 mmol/L (ref 96–106)
Creatinine, Ser: 0.93 mg/dL (ref 0.57–1.00)
GFR calc Af Amer: 82 mL/min/{1.73_m2} (ref >59)
GFR calc non Af Amer: 71 mL/min/{1.73_m2} (ref >59)
Globulin, Total: 2.1 g/dL (ref 1.5–4.5)
Glucose: 167 mg/dL — ABNORMAL HIGH (ref 65–99)
Potassium: 5 mmol/L (ref 3.5–5.2)
Sodium: 138 mmol/L (ref 134–144)
Total Protein: 6.3 g/dL (ref 6.0–8.5)

## 2019-02-05 LAB — LIPID PANEL
Chol/HDL Ratio: 2.3 ratio (ref 0.0–4.4)
Cholesterol, Total: 205 mg/dL — ABNORMAL HIGH (ref 100–199)
HDL: 91 mg/dL (ref >39)
LDL Chol Calc (NIH): 103 mg/dL — ABNORMAL HIGH (ref 0–99)
Triglycerides: 59 mg/dL (ref 0–149)
VLDL Cholesterol Cal: 11 mg/dL (ref 5–40)

## 2019-02-05 LAB — VITAMIN D 25 HYDROXY (VIT D DEFICIENCY, FRACTURES): Vit D, 25-Hydroxy: 46.1 ng/mL (ref 30.0–100.0)

## 2019-02-05 LAB — MICROALBUMIN / CREATININE URINE RATIO
Creatinine, Urine: 65 mg/dL
Microalb/Creat Ratio: <5 mg/g creat (ref 0–29)
Microalbumin, Urine: <3 ug/mL

## 2019-02-06 ENCOUNTER — Other Ambulatory Visit: Payer: Self-pay | Admitting: *Deleted

## 2019-02-06 ENCOUNTER — Encounter: Payer: Self-pay | Admitting: Family Medicine

## 2019-02-06 DIAGNOSIS — R748 Abnormal levels of other serum enzymes: Secondary | ICD-10-CM

## 2019-02-06 DIAGNOSIS — E7800 Pure hypercholesterolemia, unspecified: Secondary | ICD-10-CM

## 2019-02-06 LAB — CYTOLOGY - PAP
Comment: NEGATIVE
Diagnosis: NEGATIVE
High risk HPV: NEGATIVE

## 2019-02-11 LAB — CDIFF NAA+O+P+STOOL CULTURE
E coli, Shiga toxin Assay: NEGATIVE
Toxigenic C. Difficile by PCR: NEGATIVE

## 2019-05-06 DIAGNOSIS — Z794 Long term (current) use of insulin: Secondary | ICD-10-CM | POA: Diagnosis not present

## 2019-05-06 DIAGNOSIS — E109 Type 1 diabetes mellitus without complications: Secondary | ICD-10-CM | POA: Diagnosis not present

## 2019-05-06 DIAGNOSIS — E1065 Type 1 diabetes mellitus with hyperglycemia: Secondary | ICD-10-CM | POA: Diagnosis not present

## 2019-05-08 ENCOUNTER — Other Ambulatory Visit: Payer: Self-pay

## 2019-05-14 ENCOUNTER — Other Ambulatory Visit: Payer: Self-pay

## 2019-05-14 ENCOUNTER — Other Ambulatory Visit: Payer: BC Managed Care – PPO

## 2019-05-14 DIAGNOSIS — R748 Abnormal levels of other serum enzymes: Secondary | ICD-10-CM | POA: Diagnosis not present

## 2019-05-14 DIAGNOSIS — E7800 Pure hypercholesterolemia, unspecified: Secondary | ICD-10-CM | POA: Diagnosis not present

## 2019-05-15 ENCOUNTER — Other Ambulatory Visit: Payer: Self-pay | Admitting: *Deleted

## 2019-05-15 DIAGNOSIS — E7800 Pure hypercholesterolemia, unspecified: Secondary | ICD-10-CM

## 2019-05-15 LAB — HEPATIC FUNCTION PANEL
ALT: 20 IU/L (ref 0–32)
AST: 21 IU/L (ref 0–40)
Albumin: 4.4 g/dL (ref 3.8–4.9)
Alkaline Phosphatase: 57 IU/L (ref 39–117)
Bilirubin Total: 0.4 mg/dL (ref 0.0–1.2)
Bilirubin, Direct: 0.12 mg/dL (ref 0.00–0.40)
Total Protein: 6.3 g/dL (ref 6.0–8.5)

## 2019-05-15 LAB — LIPID PANEL
Chol/HDL Ratio: 2.7 ratio (ref 0.0–4.4)
Cholesterol, Total: 193 mg/dL (ref 100–199)
HDL: 72 mg/dL (ref >39)
LDL Chol Calc (NIH): 109 mg/dL — ABNORMAL HIGH (ref 0–99)
Triglycerides: 64 mg/dL (ref 0–149)
VLDL Cholesterol Cal: 12 mg/dL (ref 5–40)

## 2019-07-24 DIAGNOSIS — E10649 Type 1 diabetes mellitus with hypoglycemia without coma: Secondary | ICD-10-CM | POA: Diagnosis not present

## 2019-07-24 DIAGNOSIS — R7989 Other specified abnormal findings of blood chemistry: Secondary | ICD-10-CM | POA: Diagnosis not present

## 2019-07-24 DIAGNOSIS — M21619 Bunion of unspecified foot: Secondary | ICD-10-CM | POA: Diagnosis not present

## 2019-07-24 LAB — HEMOGLOBIN A1C: Hemoglobin A1C: 6.6

## 2019-08-07 ENCOUNTER — Other Ambulatory Visit: Payer: BC Managed Care – PPO

## 2019-08-07 ENCOUNTER — Other Ambulatory Visit: Payer: Self-pay

## 2019-08-07 ENCOUNTER — Telehealth: Payer: Self-pay | Admitting: *Deleted

## 2019-08-07 DIAGNOSIS — E109 Type 1 diabetes mellitus without complications: Secondary | ICD-10-CM | POA: Diagnosis not present

## 2019-08-07 DIAGNOSIS — E1065 Type 1 diabetes mellitus with hyperglycemia: Secondary | ICD-10-CM | POA: Diagnosis not present

## 2019-08-07 DIAGNOSIS — E7800 Pure hypercholesterolemia, unspecified: Secondary | ICD-10-CM | POA: Diagnosis not present

## 2019-08-07 DIAGNOSIS — N951 Menopausal and female climacteric states: Secondary | ICD-10-CM | POA: Diagnosis not present

## 2019-08-07 DIAGNOSIS — Z794 Long term (current) use of insulin: Secondary | ICD-10-CM | POA: Diagnosis not present

## 2019-08-07 NOTE — Telephone Encounter (Signed)
Order entered, please release and let Verdis Frederickson know

## 2019-08-07 NOTE — Telephone Encounter (Signed)
Patient was here was for labs this am and asked Verdis Frederickson to ask you if you would please add on hormone levels. She thinks she is going through menopause.

## 2019-08-08 ENCOUNTER — Other Ambulatory Visit: Payer: Self-pay | Admitting: *Deleted

## 2019-08-08 DIAGNOSIS — E7800 Pure hypercholesterolemia, unspecified: Secondary | ICD-10-CM

## 2019-08-08 DIAGNOSIS — Z5181 Encounter for therapeutic drug level monitoring: Secondary | ICD-10-CM

## 2019-08-08 DIAGNOSIS — N950 Postmenopausal bleeding: Secondary | ICD-10-CM

## 2019-08-08 LAB — LIPID PANEL
Chol/HDL Ratio: 2.8 ratio (ref 0.0–4.4)
Cholesterol, Total: 216 mg/dL — ABNORMAL HIGH (ref 100–199)
HDL: 76 mg/dL (ref >39)
LDL Chol Calc (NIH): 129 mg/dL — ABNORMAL HIGH (ref 0–99)
Triglycerides: 65 mg/dL (ref 0–149)
VLDL Cholesterol Cal: 11 mg/dL (ref 5–40)

## 2019-08-08 LAB — FOLLICLE STIMULATING HORMONE: FSH: 47.4 m[IU]/mL

## 2019-08-08 MED ORDER — ROSUVASTATIN CALCIUM 10 MG PO TABS: 10.0000 mg | ORAL_TABLET | Freq: Every day | ORAL | 2 refills | Status: DC

## 2019-08-08 NOTE — Progress Notes (Signed)
If she had a period last week, and her [place] level is elevated now, then it could be postmenopausal bleeding and should be evaluated by GYN.  Okay to refer to Dr. Helane Rima

## 2019-09-18 DIAGNOSIS — Z79890 Hormone replacement therapy (postmenopausal): Secondary | ICD-10-CM | POA: Diagnosis not present

## 2019-09-18 DIAGNOSIS — R7989 Other specified abnormal findings of blood chemistry: Secondary | ICD-10-CM | POA: Diagnosis not present

## 2019-09-18 DIAGNOSIS — N951 Menopausal and female climacteric states: Secondary | ICD-10-CM | POA: Diagnosis not present

## 2019-09-18 DIAGNOSIS — R6882 Decreased libido: Secondary | ICD-10-CM | POA: Diagnosis not present

## 2019-09-18 DIAGNOSIS — E039 Hypothyroidism, unspecified: Secondary | ICD-10-CM | POA: Diagnosis not present

## 2019-10-01 DIAGNOSIS — R7989 Other specified abnormal findings of blood chemistry: Secondary | ICD-10-CM | POA: Diagnosis not present

## 2019-10-25 DIAGNOSIS — Z13820 Encounter for screening for osteoporosis: Secondary | ICD-10-CM | POA: Diagnosis not present

## 2019-10-25 DIAGNOSIS — N950 Postmenopausal bleeding: Secondary | ICD-10-CM | POA: Diagnosis not present

## 2019-10-31 LAB — HM DEXA SCAN

## 2019-11-01 ENCOUNTER — Other Ambulatory Visit: Payer: Self-pay

## 2019-11-01 ENCOUNTER — Other Ambulatory Visit: Payer: BC Managed Care – PPO

## 2019-11-01 DIAGNOSIS — Z5181 Encounter for therapeutic drug level monitoring: Secondary | ICD-10-CM | POA: Diagnosis not present

## 2019-11-01 DIAGNOSIS — E7800 Pure hypercholesterolemia, unspecified: Secondary | ICD-10-CM

## 2019-11-02 LAB — LIPID PANEL
Chol/HDL Ratio: 2.4 ratio (ref 0.0–4.4)
Cholesterol, Total: 168 mg/dL (ref 100–199)
HDL: 71 mg/dL (ref >39)
LDL Chol Calc (NIH): 87 mg/dL (ref 0–99)
Triglycerides: 48 mg/dL (ref 0–149)
VLDL Cholesterol Cal: 10 mg/dL (ref 5–40)

## 2019-11-02 LAB — HEPATIC FUNCTION PANEL
ALT: 17 IU/L (ref 0–32)
AST: 18 IU/L (ref 0–40)
Albumin: 4 g/dL (ref 3.8–4.9)
Alkaline Phosphatase: 68 IU/L (ref 44–121)
Bilirubin Total: 0.4 mg/dL (ref 0.0–1.2)
Bilirubin, Direct: 0.14 mg/dL (ref 0.00–0.40)
Total Protein: 6 g/dL (ref 6.0–8.5)

## 2019-11-05 ENCOUNTER — Encounter: Payer: Self-pay | Admitting: Family Medicine

## 2019-11-07 DIAGNOSIS — E1065 Type 1 diabetes mellitus with hyperglycemia: Secondary | ICD-10-CM | POA: Diagnosis not present

## 2019-11-07 DIAGNOSIS — E109 Type 1 diabetes mellitus without complications: Secondary | ICD-10-CM | POA: Diagnosis not present

## 2019-11-07 DIAGNOSIS — Z794 Long term (current) use of insulin: Secondary | ICD-10-CM | POA: Diagnosis not present

## 2019-11-08 ENCOUNTER — Other Ambulatory Visit: Payer: Self-pay | Admitting: Family Medicine

## 2019-11-10 NOTE — Progress Notes (Signed)
Chief Complaint  Patient presents with  . Medical Clearance    clearance for surgery on 12/10/19.    Scheduled 12/10/19 for cosmetic surgery.  She will be having rhytidectomy, forehead lift and blepharoplasty by Dr. Mont Dutton in [place], done all at once. She brings in forms to be filled out, and requiring labs, EKG.  Hyperlipidemia:  Patient is a diabetic, and LDL was 129 on 6/30.  Her Crestor dose was increased from 5 to 10mg , and her repeat lipids were better. She is tolerating the statin without side effects. Lab Results  Component Value Date   CHOL 168 11/01/2019   HDL 71 11/01/2019   LDLCALC 87 11/01/2019   TRIG 48 11/01/2019   CHOLHDL 2.4 11/01/2019   She had been referred to Dr. Helane Rima due to possible postmenopausal bleeding.  She had [place] in the menopausal range, and was bleeding. She reports that she had been in [place] prior to seeing Dr. Helane Rima. She started having a lot of hot flashes while in [place], and she saw a doctor there who was recommended by a friend, and she was started on estrogen and progesterone, and testosterone pellets.  The hot flashes resolved, she has been sleeping much better.  She saw Dr. Helane Rima a few weeks ago, after being on the hormones.  She had a normal exam. She had an ultrasound done.  She ordered a DEXA, found to be osteopenic, and put on calcium. No records were received.  Last Wednesday she had a period.  She was with her daughter who had her cycle.     Type 1 DM, under the care of Dr. Roberto Scales. Last seenin 07/2019, A1c 6.6%. Only rare hypoglycemia.  Some highs recently related to not dosing properly or having a cookie.  PMH, PSH, SH reviewed  Outpatient Encounter Medications as of 11/11/2019  Medication Sig Note  . acetone, urine, test (RELION KETONE) strip Check every day in AM   . Calcium Carbonate (CALCIUM 600 PO) Take 1 tablet by mouth daily.   . cholecalciferol (VITAMIN D3) 25 MCG (1000 UNIT) tablet Take 1,000 Units by mouth daily.   Marland Kitchen COENZYME Q10 PO  Take 1 tablet by mouth daily.   . dapagliflozin propanediol (FARXIGA) 5 MG TABS tablet Take by mouth.   . estradiol (CLIMARA - DOSED IN MG/24 HR) 0.025 mg/24hr patch Place 0.025 mg onto the skin once a week.   . insulin aspart (NOVOLOG) 100 UNIT/ML injection Inject into the skin 3 (three) times daily before meals. 12/18/2015: Insulin pump...continuous   . Insulin Syringe-Needle U-100 (RELION INSULIN SYRINGE) 31G X 15/64" 0.3 ML MISC 4-5 per day   . Lancets MISC 1 each by Other route Six (6) times a day.   . levothyroxine (SYNTHROID) 25 MCG tablet Take 25 mcg by mouth daily before breakfast.   . progesterone (PROMETRIUM) 100 MG capsule    . rosuvastatin (CRESTOR) 10 MG tablet TAKE 1 TABLET ONCE DAILY.   Marland Kitchen VITAMIN E PO Take 2 capsules by mouth daily.   Marland Kitchen ALPRAZolam (XANAX) 0.5 MG tablet Take 0.5-1 tablets (0.25-0.5 mg total) by mouth 3 (three) times daily as needed for anxiety (for flying). (Patient not taking: Reported on 11/11/2019) 02/04/2019: Uses prn flying, and rarely in the middle of the night for sleep (1/2 tablet)  . glucagon (GLUCAGON EMERGENCY) 1 MG injection Inject into the muscle. (Patient not taking: Reported on 11/11/2019)   . ibuprofen (ADVIL,MOTRIN) 200 MG tablet Take 800 mg by mouth every 8 (eight) hours as needed  for headache or cramping (pain). (Patient not taking: Reported on 11/11/2019)   . Insulin Detemir (LEVEMIR FLEXTOUCH) 100 UNIT/ML Pen 9 units twice a day. May increase as needed to keep fasting glucose between 70 and 130. Max dose 40 units daily (Patient not taking: Reported on 11/11/2019) 01/25/2018: For emergencies if pump fails  . insulin lispro (HUMALOG KWIKPEN) 100 UNIT/ML KwikPen Inject into the skin. (Patient not taking: Reported on 11/11/2019) 01/25/2018: In case needed if pump stops working  . loperamide (IMODIUM A-D) 2 MG tablet Take 2 mg by mouth as needed for diarrhea or loose stools. (Patient not taking: Reported on 11/11/2019)   . [DISCONTINUED] Canagliflozin  (INVOKANA PO) Take by mouth daily.    . [DISCONTINUED] Cannabidiol 100 MG/ML SOLN Take by mouth.    . [DISCONTINUED] Cholecalciferol (VITAMIN D3) 1.25 MG (50000 UT) TABS Take 1 tablet by mouth daily.   . [DISCONTINUED] levothyroxine (SYNTHROID, LEVOTHROID) 50 MCG tablet Take 50 mcg by mouth daily before breakfast.    . [DISCONTINUED] rosuvastatin (CRESTOR) 10 MG tablet Take 1 tablet (10 mg total) by mouth daily.   . [DISCONTINUED] TURMERIC PO Take 1 tablet by mouth daily.    No facility-administered encounter medications on file as of 11/11/2019.   Allergies  Allergen Reactions  . Penicillins     As a child  Has patient had a PCN reaction causing immediate rash, facial/tongue/throat swelling, SOB or lightheadedness with hypotension: unknown Has patient had a PCN reaction causing severe rash involving mucus membranes or skin necrosis: unknown Has patient had a PCN reaction that required hospitalization: unknown Has patient had a PCN reaction occurring within the last 10 years: no If all of the above answers are "NO", then may proceed with Cephalosporin use.    ROS: no fever, chills, URI symptoms, headache, dizziness, chest pain, shortness of breath, GI complaints, bleeding, bruising, rash.  She had period/vaginal bleeding last week.  Hot flashes resolved on HRT. She had recent yeast infection, resolved with OTC Monistat. No changes to hair/skin/nails/energy/weight. Compliant with thyroid med.    PHYSICAL EXAM:  BP 100/64   Pulse 72   Ht 5\' 5"  (1.651 m)   Wt 122 lb (55.3 kg)   LMP 11/06/2019   BMI 20.30 kg/m   Wt Readings from Last 3 Encounters:  11/11/19 122 lb (55.3 kg)  02/04/19 126 lb 3.2 oz (57.2 kg)  01/25/18 121 lb 3.2 oz (55 kg)   Well-appearing, pleasant, suntanned female, in good spirits, in no distress HEENT: conjunctiva and sclera are clear, EOMI. Wearing mask Neck: no lymphadenopathy, thyromegaly or carotid bruit Heart: regular rate and rhythm, no murmur or  ectopy Lungs: clear bilaterally Back: no spinal or CVA tenderness Abdomen: soft, nontender, no organomegaly or mass Extremities: no edema, 2+ pulses Psych: normal mood, affect, hygiene and grooming Neuro: alert and oriented, normal strength, gait, DTR's  EKG: sinus bradycardic, low voltage. No acute abnormalities.   ASSESSMENT/PLAN:  Pre-op evaluation - EKG and labs done; will complete forms clearing for surgery upon all results - Plan: CBC with Differential/Platelet, PT AND PTT, hCG, serum, qualitative, Basic metabolic panel, EKG [id number]  Need for influenza vaccination - Plan: Flu Vaccine QUAD 6+ mos PF IM (Fluarix Quad PF)  Type 1 diabetes mellitus with hypoglycemia and without coma ([place]) - well controlled - Plan: Basic metabolic panel  Irregular menses - poss postmenopausal bleeding on HRT. Pt to contact. Dr. Helane Rima. Will request records (got [place], but no notes, DEXA) - Plan: hCG, serum, qualitative  ROR [facility]  EKG, CBC, chem, PT/PTT, HCG

## 2019-11-11 ENCOUNTER — Ambulatory Visit (INDEPENDENT_AMBULATORY_CARE_PROVIDER_SITE_OTHER): Payer: BC Managed Care – PPO | Admitting: Family Medicine

## 2019-11-11 ENCOUNTER — Other Ambulatory Visit: Payer: Self-pay

## 2019-11-11 ENCOUNTER — Encounter: Payer: Self-pay | Admitting: Family Medicine

## 2019-11-11 VITALS — BP 100/64 | HR 72 | Ht 65.0 in | Wt 122.0 lb

## 2019-11-11 DIAGNOSIS — Z01818 Encounter for other preprocedural examination: Secondary | ICD-10-CM

## 2019-11-11 DIAGNOSIS — Z23 Encounter for immunization: Secondary | ICD-10-CM

## 2019-11-11 DIAGNOSIS — N926 Irregular menstruation, unspecified: Secondary | ICD-10-CM

## 2019-11-11 DIAGNOSIS — E10649 Type 1 diabetes mellitus with hypoglycemia without coma: Secondary | ICD-10-CM

## 2019-11-11 NOTE — Patient Instructions (Signed)
Keep Dr. Helane Rima in the loop with your bleeding . She may need to re-evaluate you or change your hormones due to postmenopausal bleeding.

## 2019-11-12 DIAGNOSIS — Z1231 Encounter for screening mammogram for malignant neoplasm of breast: Secondary | ICD-10-CM | POA: Diagnosis not present

## 2019-11-12 LAB — CBC WITH DIFFERENTIAL/PLATELET
Basophils Absolute: 0 10*3/uL (ref 0.0–0.2)
Basos: 1 %
EOS (ABSOLUTE): 0.1 10*3/uL (ref 0.0–0.4)
Eos: 3 %
Hematocrit: 46.2 % (ref 34.0–46.6)
Hemoglobin: 14.9 g/dL (ref 11.1–15.9)
Immature Grans (Abs): 0 10*3/uL (ref 0.0–0.1)
Immature Granulocytes: 0 %
Lymphocytes Absolute: 1.1 10*3/uL (ref 0.7–3.1)
Lymphs: 25 %
MCH: 29.9 pg (ref 26.6–33.0)
MCHC: 32.3 g/dL (ref 31.5–35.7)
MCV: 93 fL (ref 79–97)
Monocytes Absolute: 0.5 10*3/uL (ref 0.1–0.9)
Monocytes: 10 %
Neutrophils Absolute: 2.9 10*3/uL (ref 1.4–7.0)
Neutrophils: 61 %
Platelets: 314 10*3/uL (ref 150–450)
RBC: 4.99 x10E6/uL (ref 3.77–5.28)
RDW: 12.9 % (ref 11.7–15.4)
WBC: 4.7 10*3/uL (ref 3.4–10.8)

## 2019-11-12 LAB — BASIC METABOLIC PANEL
BUN/Creatinine Ratio: 19 (ref 9–23)
BUN: 16 mg/dL (ref 6–24)
CO2: 26 mmol/L (ref 20–29)
Calcium: 9.3 mg/dL (ref 8.7–10.2)
Chloride: 102 mmol/L (ref 96–106)
Creatinine, Ser: 0.83 mg/dL (ref 0.57–1.00)
GFR calc Af Amer: 93 mL/min/{1.73_m2} (ref >59)
GFR calc non Af Amer: 81 mL/min/{1.73_m2} (ref >59)
Glucose: 137 mg/dL — ABNORMAL HIGH (ref 65–99)
Potassium: 5 mmol/L (ref 3.5–5.2)
Sodium: 141 mmol/L (ref 134–144)

## 2019-11-12 LAB — PT AND PTT
INR: 1 (ref 0.9–1.2)
Prothrombin Time: 10.5 s (ref 9.1–12.0)
aPTT: 28 s (ref 24–33)

## 2019-11-12 LAB — HCG, SERUM, QUALITATIVE: hCG,Beta Subunit,Qual,Serum: NEGATIVE m[IU]/mL (ref <6)

## 2019-11-12 LAB — HM MAMMOGRAPHY

## 2019-11-14 ENCOUNTER — Encounter: Payer: Self-pay | Admitting: *Deleted

## 2019-11-14 DIAGNOSIS — H04123 Dry eye syndrome of bilateral lacrimal glands: Secondary | ICD-10-CM | POA: Diagnosis not present

## 2019-11-15 ENCOUNTER — Encounter: Payer: Self-pay | Admitting: Family Medicine

## 2019-11-21 ENCOUNTER — Telehealth: Payer: Self-pay | Admitting: Family Medicine

## 2019-11-21 ENCOUNTER — Telehealth: Payer: Self-pay | Admitting: *Deleted

## 2019-11-21 NOTE — Telephone Encounter (Signed)
error 

## 2019-11-21 NOTE — Telephone Encounter (Signed)
Received requested medical records from [facility]

## 2019-11-22 ENCOUNTER — Encounter: Payer: Self-pay | Admitting: Family Medicine

## 2019-11-25 DIAGNOSIS — N926 Irregular menstruation, unspecified: Secondary | ICD-10-CM | POA: Diagnosis not present

## 2019-12-10 HISTORY — PX: FACIAL COSMETIC SURGERY: SHX629

## 2020-01-21 DIAGNOSIS — H5213 Myopia, bilateral: Secondary | ICD-10-CM | POA: Diagnosis not present

## 2020-01-21 DIAGNOSIS — E103293 Type 1 diabetes mellitus with mild nonproliferative diabetic retinopathy without macular edema, bilateral: Secondary | ICD-10-CM | POA: Diagnosis not present

## 2020-01-21 LAB — HM DIABETES EYE EXAM

## 2020-02-04 NOTE — Patient Instructions (Addendum)
  HEALTH MAINTENANCE RECOMMENDATIONS:  It is recommended that you get at least 30 minutes of aerobic exercise at least 5 days/week (for weight loss, you may need as much as 60-90 minutes). This can be any activity that gets your heart rate up. This can be divided in 10-15 minute intervals if needed, but try and build up your endurance at least once a week.  Weight bearing exercise is also recommended twice weekly.  Eat a healthy diet with lots of vegetables, fruits and fiber.  "Colorful" foods have a lot of vitamins (ie green vegetables, tomatoes, red peppers, etc).  Limit sweet tea, regular sodas and alcoholic beverages, all of which has a lot of calories and sugar.  Up to 1 alcoholic drink daily may be beneficial for women (unless trying to lose weight, watch sugars).  Drink a lot of water.  Calcium recommendations are 1200-1500 mg daily (1500 mg for postmenopausal women or women without ovaries), and vitamin D 1000 IU daily.  This should be obtained from diet and/or supplements (vitamins), and calcium should not be taken all at once, but in divided doses.  Monthly self breast exams and yearly mammograms for women over the age of 40 is recommended.  Sunscreen of at least SPF 30 should be used on all sun-exposed parts of the skin when outside between the hours of 10 am and 4 pm (not just when at beach or pool, but even with exercise, golf, tennis, and yard work!)  Use a sunscreen that says "broad spectrum" so it covers both UVA and UVB rays, and make sure to reapply every 1-2 hours.  Remember to change the batteries in your smoke detectors when changing your clock times in the spring and fall. Carbon monoxide detectors are recommended for your home.  Use your seat belt every time you are in a car, and please drive safely and not be distracted with cell phones and texting while driving.  I recommend getting the new shingles vaccine (Shingrix).  If you have commercial insurance, check with your  insurance to verify what your out of pocket cost may be (usually covered as preventative, but better to verify to avoid any surprises, as this vaccine is expensive), and then schedule a nurse visit at our office when convenient (based on the possible side effects as discussed).   This is a series of 2 injections, spaced 2 months apart.  It doesn't have to be exactly 2 months apart (but can't be under 2 months), if that isn't feasible for your schedule, but try and get them close to 2 months (and definitely within 6 months of each other, or else the efficacy of the vaccine drops off).  

## 2020-02-04 NOTE — Progress Notes (Signed)
Chief Complaint  Patient presents with  . Annual Exam    Fasting annual exam with pelvic. Had eye exam 12/14 with Dr. Enedina Finner will get. If you take a lot of vitamins can this effect your liver?    Briana Lopez is a 54 y.o. female who presents for a complete physical.    She underwent cosmetic surgery by Dr. Mont Dutton in [place] on 12/10/19--rhytidectomy, forehead lift and blepharoplasty. She is pleased with the results, no complications.  Still has some discoloration from the laser treatment.  She continues to see her endocrinologist regularly for her diabetes care and hypothyroidism; she is due now but doesn't have a scheduled appt. She is requesting A1c to be done here.. Care Everywhere results/notes were reviewed. She denies any hypoglycemia. Some higher sugars over the holidays. She checks feet regularly without concerns. She had diabetic eye exam earlier this month (no record received), reports she had mild diabetic retinopathy.  She denies any vision changes. Last A1c was 6.6 on 07/24/19.  . Last TSH in [place] was 12/2017 (1.790), last in epic was 1 year ago: Lab Results  Component Value Date   TSH 1.470 02/04/2019   She denies any thyroid-related complaints.  Hyperlipidemia:  She reports compliance with Crestor 10 mg daily, and denies side effects. Dose had been increased from $RemoveBefore'5mg'FsfTRCdtvZvMZ$  to $R'10mg'Fd$  in June, when LDL was 129. She reports in error she got a $Rem'5mg'TbmU$  refill, but last refill was back up to $Rem'10mg'OeMQ$ . Lipids were at goal on last check: Lab Results  Component Value Date   CHOL 168 11/01/2019   HDL 71 11/01/2019   LDLCALC 87 11/01/2019   TRIG 48 11/01/2019   CHOLHDL 2.4 11/01/2019   Menopausal symptoms:  Started on HRT by a doctor in [place].  Symptoms resolved.  She had abnormal vaginal bleeding (with elevated FSH) and was referred back to Dr. Helane Rima.  She had an US done  Her Prometrium dose was increased, and she hasn't had any bleeding since then.   Immunization History   Administered Date(s) Administered  . Hepatitis A, Adult 05/16/2014, 02/04/2019  . Influenza Split 01/06/2016, 12/16/2017  . Influenza, Quadrivalent, Recombinant, Inj, Pf 12/16/2017  . Influenza, Seasonal, Injecte, Preservative Fre 11/08/2010  . Influenza,inj,Quad PF,6+ Mos 09/25/2014, 01/06/2016, 03/03/2017, 11/11/2019  . Influenza-Unspecified 11/07/2018  . Moderna Sars-Covid-2 Vaccination 05/16/2019, 06/13/2019, 01/10/2020  . Pneumococcal Polysaccharide-23 03/03/2003, 01/25/2018  . Tdap 04/05/2010, 05/16/2014  . Typhoid Inactivated 05/16/2014   Last Pap smear: 01/2019, normal, no high risk HPV Last mammogram: 11/2019 Last colonoscopy: 12/2017, hyperplastic polyps Last DEXA: T-1.5 L fem neck 10/2019 (Dr. Helane Rima) Dentist: 2 times/yr Ophtho: yearly, last was earlier this month Exercise: 2 days High intensity cardio (HIIT or Peloton workout); 3x/week cardio with strength training; 2 days of yoga or walking. Walks dog daily for an hour. Golfing with her husband (walks the course at the beach).  Normal vitamin D level (46.1) in 01/2019  PMH, PSH, SH reviewed  Drinking more often over the holidays, most days (usually doesn't think Sun through [place]). In January does a challenge and doesn't drink (just 2 events); felt great when she did it last year. Reportedly drinks champagne daily when in [place] for a month (going again in February)  Outpatient Encounter Medications as of 02/05/2020  Medication Sig Note  . acetone, urine, test strip Check every day in AM   . Ascorbic Acid (VITAMIN C) 1000 MG tablet Take 1,000 mg by mouth daily.   . Biotin [place]  1 each by mouth daily.   . Bromelains 500 MG TABS Take 1 tablet by mouth daily. 02/05/2020: (digestive enzyme, per pt)  . Calcium Carbonate (CALCIUM 600 PO) Take 1 tablet by mouth daily.   . cholecalciferol (VITAMIN D3) 25 MCG (1000 UNIT) tablet Take 1,000 Units by mouth daily.   Marland Kitchen COENZYME Q10 PO Take 1 tablet by mouth daily.    . dapagliflozin propanediol (FARXIGA) 5 MG TABS tablet Take by mouth.   . estradiol (CLIMARA - DOSED IN MG/24 HR) 0.025 mg/24hr patch Place 0.025 mg onto the skin once a week.   . Insulin Syringe-Needle U-100 31G X 15/64" 0.3 ML MISC 4-5 per day   . Lancets MISC 1 each by Other route Six (6) times a day.   . levothyroxine (SYNTHROID) 25 MCG tablet Take 25 mcg by mouth daily before breakfast.   . Lysine 500 MG CAPS Take 500 mg by mouth daily.   . magnesium gluconate (MAGONATE) 500 MG tablet Take 500 mg by mouth daily.   . N-ACETYL CYSTEINE PO Take 1 tablet by mouth daily.   . NON FORMULARY Take 2 capsules by mouth daily. 02/05/2020: BCAA's (amino acid for muscle recovery)  . progesterone (PROMETRIUM) 200 MG capsule Take 200 mg by mouth daily.   . rosuvastatin (CRESTOR) 10 MG tablet TAKE 1 TABLET ONCE DAILY.   Marland Kitchen Selenium 200 MCG CAPS Take 1 capsule by mouth daily.   . Thiamine HCl (VITAMIN B-1) 250 MG tablet Take 250 mg by mouth daily.   Marland Kitchen VITAMIN E PO Take 2 capsules by mouth daily.   Marland Kitchen ZINC PICOLINATE PO Take 1 capsule by mouth daily.   . [DISCONTINUED] Brompheniramine-Pseudoeph (BROMALINE PO) Take 1 capsule by mouth daily.   Marland Kitchen ALPRAZolam (XANAX) 0.5 MG tablet Take 0.5-1 tablets (0.25-0.5 mg total) by mouth 3 (three) times daily as needed for anxiety (for flying). (Patient not taking: No sig reported) 02/04/2019: Uses prn flying, and rarely in the middle of the night for sleep (1/2 tablet)  . glucagon 1 MG injection Inject into the muscle. (Patient not taking: No sig reported)   . ibuprofen (ADVIL,MOTRIN) 200 MG tablet Take 800 mg by mouth every 8 (eight) hours as needed for headache or cramping (pain). (Patient not taking: No sig reported)   . insulin aspart (NOVOLOG) 100 UNIT/ML injection Inject into the skin 3 (three) times daily before meals. (Patient not taking: Reported on 02/05/2020) 12/18/2015: Insulin pump...continuous   . insulin detemir (LEVEMIR) 100 UNIT/ML FlexPen 9 units twice a  day. May increase as needed to keep fasting glucose between 70 and 130. Max dose 40 units daily (Patient not taking: No sig reported) 01/25/2018: For emergencies if pump fails  . insulin lispro (HUMALOG) 100 UNIT/ML KwikPen Inject into the skin. (Patient not taking: No sig reported) 01/25/2018: In case needed if pump stops working  . loperamide (IMODIUM A-D) 2 MG tablet Take 2 mg by mouth as needed for diarrhea or loose stools. (Patient not taking: No sig reported)   . [DISCONTINUED] progesterone (PROMETRIUM) 100 MG capsule     No facility-administered encounter medications on file as of 02/05/2020.   Allergies  Allergen Reactions  . Penicillins     As a child  Has patient had a PCN reaction causing immediate rash, facial/tongue/throat swelling, SOB or lightheadedness with hypotension: unknown Has patient had a PCN reaction causing severe rash involving mucus membranes or skin necrosis: unknown Has patient had a PCN reaction that required hospitalization: unknown Has patient had  a PCN reaction occurring within the last 10 years: no If all of the above answers are "NO", then may proceed with Cephalosporin use.   Allergies  Allergen Reactions  . Penicillins     As a child  Has patient had a PCN reaction causing immediate rash, facial/tongue/throat swelling, SOB or lightheadedness with hypotension: unknown Has patient had a PCN reaction causing severe rash involving mucus membranes or skin necrosis: unknown Has patient had a PCN reaction that required hospitalization: unknown Has patient had a PCN reaction occurring within the last 10 years: no If all of the above answers are "NO", then may proceed with Cephalosporin use.    ROS: The patient denies anorexia, fever, weight changes, headaches, vision changes, decreased hearing, ear pain, sore throat, breast concerns, chest pain, palpitations, dizziness, syncope, dyspnea on exertion, cough, swelling, nausea, vomiting, abdominal pain, melena,  hematochezia, indigestion/heartburn, hematuria, incontinence, dysuria, genital lesions, vaginal dryness,joint pains; no numbness, tingling, weakness, tremor, suspicious skin lesions, depression, anxiety, abnormal bleeding/bruising, or enlarged lymph nodes. No vaginal bleeding. No night sweats or hot flashes. Denies any neuropathy Moods are good. No further insomnia Notices a dark drainage in both ears since her surgery. Husband reports snoring. No daytime somnolence or unrefreshed sleep.  PHYSICAL EXAM:  BP 100/68   Pulse 68   Ht _0  (1.651 m)   Wt 121 lb 12.8 oz (55.2 kg)   LMP 11/06/2019   BMI 20.27 kg/m   Wt Readings from Last 3 Encounters:  11/11/19 122 lb (55.3 kg)  02/04/19 126 lb 3.2 oz (57.2 kg)  01/25/18 121 lb 3.2 oz (55 kg)    General Appearance:   Alert, cooperative, no distress, appears stated age  Head:   Normocephalic, without obvious abnormality, atraumatic. WHSS noted behind both ears  Eyes:   PERRL, conjunctiva/corneas clear, EOM's intact, fundi benign.   Ears:   Normal TM's and external ear canals. No cerumen or any drainage noted, EAC's are normal.  Nose:  Not examined, wearing mask due to COVID-19 pandemic  Throat:  Not examined, wearing mask due to COVID-19 pandemic  Neck:  Supple, no lymphadenopathy; thyroid: noenlargement/ tenderness/nodules; no carotid bruit or JVD  Back:  Spine nontender, no curvature, ROM normal, no CVA tenderness  Lungs:   Clear to auscultation bilaterally without wheezes, rales or ronchi; respirations unlabored  Chest Wall:   No tenderness or deformity  Heart:   Regular rate and rhythm, S1 and S2 normal, no murmur, rub  or gallop  Breast Exam:   No tenderness, masses, or nipple discharge or inversion.No axillary lymphadenopathy.   Abdomen:   Soft, non-tender, nondistended, normoactive bowel sounds,   no masses, no hepatosplenomegaly  Genitalia:   Normal external genitalia  without lesions. BUS and vagina normal; no cervical motion tenderness.  No abnormal vaginal discharge. Uterus and adnexa not enlarged, nontender, no masses. Pap not performed  Rectal:    Normal sphincter tone, no masses, heme negative light brown stool  Extremities:  No clubbing, cyanosis or edema. Normal monofilament exam  Pulses:  2+ and symmetric all extremities  Skin:  Skin color, texture, turgor normal, no rashes or lesions  Lymph nodes:  Cervical, supraclavicular, and axillary nodes normal  Neurologic:  Normal strength, sensation and gait; reflexes 2+ and symmetric throughout  Psych: Normal mood, affect, hygiene and grooming.  Normal diabetic foot exam.    Lab Results  Component Value Date   HGBA1C 6.7 (A) 02/05/2020     ASSESSMENT/PLAN:  Annual physical exam - Plan:  POCT Urinalysis DIP (Proadvantage Device), Comprehensive metabolic panel, TSH  Hypothyroidism, unspecified type - euthyroid by history, due for TSH - Plan: TSH  Pure hypercholesterolemia - lipids at goal in Sept; recent error in taking 71m; she is back on Crestor 127m cont - Plan: rosuvastatin (CRESTOR) 10 MG tablet  [place] 1 diabetes [place] hypoglycemia and [place]- well controlled; to schedule f/u with endo, will forward A1c results. - Plan: HgB A1c, Comprehensive metabolic panel, TSH, Microalbumin / creatinine urine ratio  Medication monitoring encounter - Plan: Comprehensive metabolic panel, TSH, Microalbumin / creatinine urine ratio  Need for hepatitis C screening test - Plan: Hepatitis C antibody  Anxiety - related to flying; going to [place] again in Feb.  Refill xanax for prn use. - Plan: ALPRAZolam (XANAX) 0.5 MG tablet   c-met,TSH, urine microalbumin, HepC Ab  CBC not needed, done recently and normal Lab Results  Component Value Date   WBC 4.7 11/11/2019   HGB 14.9 11/11/2019   HCT 46.2 11/11/2019   MCV 93 11/11/2019   PLT 314  11/11/2019    Prev discussed ACEI/ARB--[facility] doesn't have her on; BP has been good, microalbumin has been negative. Due for recheck today.  Discussed monthly self breast exams and yearly mammograms; at least 30 minutes of aerobic activity at least 5 days/week, weight bearing exercise at least 2x/wk; proper sunscreen use reviewed; healthy diet, including goals of calcium and vitamin D intake and alcohol recommendations (less than or equal to 1 drink/day--she was encouraged to cut back) reviewed; regular seatbelt use; changing batteries in smoke detectors, carbon monoxide detectors. Immunization recommendations discussed--continue yearly flu shots. Shingrix recommended--risks/SE reviewed, to check with insurance and schedule [place] Colonoscopy recommendations reviewed--UTD, due again 12/2027.  F/u 1 year, sooner prn.

## 2020-02-05 ENCOUNTER — Encounter: Payer: Self-pay | Admitting: Family Medicine

## 2020-02-05 ENCOUNTER — Ambulatory Visit (INDEPENDENT_AMBULATORY_CARE_PROVIDER_SITE_OTHER): Payer: BC Managed Care – PPO | Admitting: Family Medicine

## 2020-02-05 ENCOUNTER — Encounter: Payer: Self-pay | Admitting: *Deleted

## 2020-02-05 ENCOUNTER — Other Ambulatory Visit: Payer: Self-pay

## 2020-02-05 VITALS — BP 100/68 | HR 68 | Ht 65.0 in | Wt 121.8 lb

## 2020-02-05 DIAGNOSIS — E7800 Pure hypercholesterolemia, unspecified: Secondary | ICD-10-CM

## 2020-02-05 DIAGNOSIS — Z1159 Encounter for screening for other viral diseases: Secondary | ICD-10-CM

## 2020-02-05 DIAGNOSIS — E039 Hypothyroidism, unspecified: Secondary | ICD-10-CM

## 2020-02-05 DIAGNOSIS — E10649 Type 1 diabetes mellitus with hypoglycemia without coma: Secondary | ICD-10-CM

## 2020-02-05 DIAGNOSIS — Z0000 Encounter for general adult medical examination without abnormal findings: Secondary | ICD-10-CM

## 2020-02-05 DIAGNOSIS — Z5181 Encounter for therapeutic drug level monitoring: Secondary | ICD-10-CM | POA: Diagnosis not present

## 2020-02-05 DIAGNOSIS — F419 Anxiety disorder, unspecified: Secondary | ICD-10-CM

## 2020-02-05 LAB — POCT URINALYSIS DIP (PROADVANTAGE DEVICE)
Bilirubin, UA: NEGATIVE
Blood, UA: NEGATIVE
Glucose, UA: 100 mg/dL — AB
Ketones, POC UA: NEGATIVE mg/dL
Leukocytes, UA: NEGATIVE
Nitrite, UA: NEGATIVE
Protein Ur, POC: NEGATIVE mg/dL
Specific Gravity, Urine: 1.005
Urobilinogen, Ur: NEGATIVE
pH, UA: 6.5 (ref 5.0–8.0)

## 2020-02-05 LAB — POCT GLYCOSYLATED HEMOGLOBIN (HGB A1C): Hemoglobin A1C: 6.7 % — AB (ref 4.0–5.6)

## 2020-02-05 MED ORDER — ALPRAZOLAM 0.5 MG PO TABS: 0.2500 mg | ORAL_TABLET | Freq: Three times a day (TID) | ORAL | 0 refills | Status: DC | PRN

## 2020-02-05 MED ORDER — ROSUVASTATIN CALCIUM 10 MG PO TABS: 10.0000 mg | ORAL_TABLET | Freq: Every day | ORAL | 3 refills | Status: DC

## 2020-02-06 DIAGNOSIS — E109 Type 1 diabetes mellitus without complications: Secondary | ICD-10-CM | POA: Diagnosis not present

## 2020-02-06 DIAGNOSIS — Z794 Long term (current) use of insulin: Secondary | ICD-10-CM | POA: Diagnosis not present

## 2020-02-06 DIAGNOSIS — E1065 Type 1 diabetes mellitus with hyperglycemia: Secondary | ICD-10-CM | POA: Diagnosis not present

## 2020-02-06 LAB — COMPREHENSIVE METABOLIC PANEL
ALT: 19 IU/L (ref 0–32)
AST: 22 IU/L (ref 0–40)
Albumin/Globulin Ratio: 2 (ref 1.2–2.2)
Albumin: 4.3 g/dL (ref 3.8–4.9)
Alkaline Phosphatase: 60 IU/L (ref 44–121)
BUN/Creatinine Ratio: 15 (ref 9–23)
BUN: 13 mg/dL (ref 6–24)
Bilirubin Total: 0.5 mg/dL (ref 0.0–1.2)
CO2: 24 mmol/L (ref 20–29)
Calcium: 9.3 mg/dL (ref 8.7–10.2)
Chloride: 102 mmol/L (ref 96–106)
Creatinine, Ser: 0.87 mg/dL (ref 0.57–1.00)
GFR calc Af Amer: 88 mL/min/{1.73_m2} (ref >59)
GFR calc non Af Amer: 76 mL/min/{1.73_m2} (ref >59)
Globulin, Total: 2.1 g/dL (ref 1.5–4.5)
Glucose: 84 mg/dL (ref 65–99)
Potassium: 4.2 mmol/L (ref 3.5–5.2)
Sodium: 139 mmol/L (ref 134–144)
Total Protein: 6.4 g/dL (ref 6.0–8.5)

## 2020-02-06 LAB — MICROALBUMIN / CREATININE URINE RATIO
Creatinine, Urine: 10.1 mg/dL
Microalb/Creat Ratio: <30 mg/g creat (ref 0–29)
Microalbumin, Urine: <3 ug/mL

## 2020-02-06 LAB — TSH: TSH: 2.06 u[IU]/mL (ref 0.450–4.500)

## 2020-02-06 LAB — HEPATITIS C ANTIBODY: Hep C Virus Ab: <0.1 s/co ratio (ref 0.0–0.9)

## 2020-03-10 ENCOUNTER — Telehealth: Payer: Self-pay | Admitting: Family Medicine

## 2020-03-10 NOTE — Telephone Encounter (Signed)
Patient request you return her call [phone]

## 2020-03-18 ENCOUNTER — Other Ambulatory Visit: Payer: BC Managed Care – PPO

## 2020-04-20 DIAGNOSIS — R7989 Other specified abnormal findings of blood chemistry: Secondary | ICD-10-CM | POA: Diagnosis not present

## 2020-04-22 ENCOUNTER — Telehealth: Payer: Self-pay

## 2020-04-22 ENCOUNTER — Other Ambulatory Visit: Payer: Self-pay | Admitting: *Deleted

## 2020-04-22 DIAGNOSIS — R7989 Other specified abnormal findings of blood chemistry: Secondary | ICD-10-CM

## 2020-04-22 NOTE — Telephone Encounter (Signed)
Ok I will give the order to Verdis Frederickson if you still have it.

## 2020-04-22 NOTE — Telephone Encounter (Signed)
Was in outgoing red folder--if you don't see it, check with V

## 2020-04-22 NOTE — Telephone Encounter (Signed)
Received lab order from Gwenlyn Perking MD for Testosterone, free + Total, serum. Sending order back to you in your folder now.

## 2020-04-22 NOTE — Telephone Encounter (Signed)
Okay to have drawn here, but results go to the ordering physician (who is in [place]).

## 2020-04-23 NOTE — Telephone Encounter (Signed)
Briana Lopez already put order in the system and still has written order incase Briana Lopez needs it.

## 2020-04-29 DIAGNOSIS — N950 Postmenopausal bleeding: Secondary | ICD-10-CM | POA: Diagnosis not present

## 2020-04-29 DIAGNOSIS — N858 Other specified noninflammatory disorders of uterus: Secondary | ICD-10-CM | POA: Diagnosis not present

## 2020-05-01 DIAGNOSIS — E109 Type 1 diabetes mellitus without complications: Secondary | ICD-10-CM | POA: Diagnosis not present

## 2020-05-01 DIAGNOSIS — E1065 Type 1 diabetes mellitus with hyperglycemia: Secondary | ICD-10-CM | POA: Diagnosis not present

## 2020-05-01 DIAGNOSIS — Z794 Long term (current) use of insulin: Secondary | ICD-10-CM | POA: Diagnosis not present

## 2020-05-21 ENCOUNTER — Other Ambulatory Visit: Payer: Self-pay

## 2020-05-21 ENCOUNTER — Other Ambulatory Visit: Payer: BC Managed Care – PPO

## 2020-05-21 DIAGNOSIS — R7989 Other specified abnormal findings of blood chemistry: Secondary | ICD-10-CM

## 2020-05-23 LAB — TESTOSTERONE,FREE AND TOTAL
Testosterone, Free: 2.9 pg/mL (ref 0.0–4.2)
Testosterone: 227 ng/dL — ABNORMAL HIGH (ref 4–50)

## 2020-06-16 DIAGNOSIS — Z20822 Contact with and (suspected) exposure to covid-19: Secondary | ICD-10-CM | POA: Diagnosis not present

## 2020-07-28 DIAGNOSIS — E1065 Type 1 diabetes mellitus with hyperglycemia: Secondary | ICD-10-CM | POA: Diagnosis not present

## 2020-07-28 DIAGNOSIS — E109 Type 1 diabetes mellitus without complications: Secondary | ICD-10-CM | POA: Diagnosis not present

## 2020-07-30 ENCOUNTER — Other Ambulatory Visit: Payer: Self-pay | Admitting: Family Medicine

## 2020-07-30 ENCOUNTER — Other Ambulatory Visit: Payer: Self-pay | Admitting: *Deleted

## 2020-07-30 DIAGNOSIS — N951 Menopausal and female climacteric states: Secondary | ICD-10-CM

## 2020-07-31 ENCOUNTER — Other Ambulatory Visit: Payer: Self-pay

## 2020-08-03 ENCOUNTER — Other Ambulatory Visit: Payer: Self-pay | Admitting: Family Medicine

## 2020-08-03 DIAGNOSIS — E109 Type 1 diabetes mellitus without complications: Secondary | ICD-10-CM | POA: Diagnosis not present

## 2020-08-03 DIAGNOSIS — E1065 Type 1 diabetes mellitus with hyperglycemia: Secondary | ICD-10-CM | POA: Diagnosis not present

## 2020-08-07 ENCOUNTER — Other Ambulatory Visit: Payer: Self-pay

## 2020-08-07 ENCOUNTER — Telehealth: Payer: Self-pay

## 2020-08-07 MED ORDER — LEVOTHYROXINE SODIUM 25 MCG PO TABS: 25.0000 ug | ORAL_TABLET | Freq: Every day | ORAL | 0 refills | Status: AC

## 2020-08-07 NOTE — Telephone Encounter (Signed)
Emailed Rx to [email or web address] was rejected and tried to call patient to confirm email and left message resent to [email or web address] and also pt's personal email

## 2020-08-07 NOTE — Telephone Encounter (Signed)
Pt is in [place] and lost thyroid med emailed script to make sure pt continue med while away. [place]

## 2020-08-24 ENCOUNTER — Other Ambulatory Visit: Payer: BC Managed Care – PPO

## 2020-08-24 ENCOUNTER — Other Ambulatory Visit: Payer: Self-pay

## 2020-08-24 DIAGNOSIS — N951 Menopausal and female climacteric states: Secondary | ICD-10-CM | POA: Diagnosis not present

## 2020-08-31 LAB — ESTRADIOL, FREE
Estradiol, Serum, MS: 207 pg/mL
Free Estradiol, Percent: 1.6 %
Free Estradiol, Serum: 3.3 pg/mL

## 2020-08-31 LAB — PROGESTERONE: Progesterone: 4.6 ng/mL

## 2020-08-31 LAB — FOLLICLE STIMULATING HORMONE: FSH: 10.9 m[IU]/mL

## 2020-08-31 LAB — TSH: TSH: 2.8 u[IU]/mL (ref 0.450–4.500)

## 2020-09-16 DIAGNOSIS — N816 Rectocele: Secondary | ICD-10-CM | POA: Diagnosis not present

## 2020-09-16 DIAGNOSIS — N393 Stress incontinence (female) (male): Secondary | ICD-10-CM | POA: Diagnosis not present

## 2020-09-16 DIAGNOSIS — N950 Postmenopausal bleeding: Secondary | ICD-10-CM | POA: Diagnosis not present

## 2020-09-30 DIAGNOSIS — R7989 Other specified abnormal findings of blood chemistry: Secondary | ICD-10-CM | POA: Diagnosis not present

## 2020-10-29 DIAGNOSIS — E1065 Type 1 diabetes mellitus with hyperglycemia: Secondary | ICD-10-CM | POA: Diagnosis not present

## 2020-10-29 DIAGNOSIS — E109 Type 1 diabetes mellitus without complications: Secondary | ICD-10-CM | POA: Diagnosis not present

## 2020-11-08 LAB — HM MAMMOGRAPHY

## 2020-11-12 DIAGNOSIS — Z1231 Encounter for screening mammogram for malignant neoplasm of breast: Secondary | ICD-10-CM | POA: Diagnosis not present

## 2020-11-25 DIAGNOSIS — M21619 Bunion of unspecified foot: Secondary | ICD-10-CM | POA: Diagnosis not present

## 2020-11-25 DIAGNOSIS — E10649 Type 1 diabetes mellitus with hypoglycemia without coma: Secondary | ICD-10-CM | POA: Diagnosis not present

## 2020-11-25 DIAGNOSIS — R7989 Other specified abnormal findings of blood chemistry: Secondary | ICD-10-CM | POA: Diagnosis not present

## 2020-11-25 DIAGNOSIS — Z6820 Body mass index (BMI) 20.0-20.9, adult: Secondary | ICD-10-CM | POA: Diagnosis not present

## 2020-11-25 LAB — HEMOGLOBIN A1C: Hemoglobin A1C: 6.5

## 2020-12-16 DIAGNOSIS — Z9641 Presence of insulin pump (external) (internal): Secondary | ICD-10-CM | POA: Diagnosis not present

## 2020-12-16 DIAGNOSIS — E109 Type 1 diabetes mellitus without complications: Secondary | ICD-10-CM | POA: Diagnosis not present

## 2021-01-08 DIAGNOSIS — Z79899 Other long term (current) drug therapy: Secondary | ICD-10-CM | POA: Diagnosis not present

## 2021-01-08 DIAGNOSIS — Z79890 Hormone replacement therapy (postmenopausal): Secondary | ICD-10-CM | POA: Diagnosis not present

## 2021-01-08 DIAGNOSIS — Z87891 Personal history of nicotine dependence: Secondary | ICD-10-CM | POA: Diagnosis not present

## 2021-01-08 DIAGNOSIS — E039 Hypothyroidism, unspecified: Secondary | ICD-10-CM | POA: Diagnosis not present

## 2021-01-08 DIAGNOSIS — Z880 Allergy status to penicillin: Secondary | ICD-10-CM | POA: Diagnosis not present

## 2021-01-08 DIAGNOSIS — R197 Diarrhea, unspecified: Secondary | ICD-10-CM | POA: Diagnosis not present

## 2021-01-08 DIAGNOSIS — Z6820 Body mass index (BMI) 20.0-20.9, adult: Secondary | ICD-10-CM | POA: Diagnosis not present

## 2021-01-08 DIAGNOSIS — E109 Type 1 diabetes mellitus without complications: Secondary | ICD-10-CM | POA: Diagnosis not present

## 2021-01-12 DIAGNOSIS — Z01419 Encounter for gynecological examination (general) (routine) without abnormal findings: Secondary | ICD-10-CM | POA: Diagnosis not present

## 2021-01-12 DIAGNOSIS — Z6821 Body mass index (BMI) 21.0-21.9, adult: Secondary | ICD-10-CM | POA: Diagnosis not present

## 2021-01-13 DIAGNOSIS — Z01419 Encounter for gynecological examination (general) (routine) without abnormal findings: Secondary | ICD-10-CM | POA: Diagnosis not present

## 2021-01-19 LAB — HM PAP SMEAR
HM Pap smear: NEGATIVE
HM Pap smear: NEGATIVE

## 2021-01-27 DIAGNOSIS — K8689 Other specified diseases of pancreas: Secondary | ICD-10-CM | POA: Diagnosis not present

## 2021-01-27 DIAGNOSIS — K8681 Exocrine pancreatic insufficiency: Secondary | ICD-10-CM | POA: Diagnosis not present

## 2021-01-27 DIAGNOSIS — K2970 Gastritis, unspecified, without bleeding: Secondary | ICD-10-CM | POA: Diagnosis not present

## 2021-02-03 DIAGNOSIS — H52203 Unspecified astigmatism, bilateral: Secondary | ICD-10-CM | POA: Diagnosis not present

## 2021-02-03 DIAGNOSIS — E103293 Type 1 diabetes mellitus with mild nonproliferative diabetic retinopathy without macular edema, bilateral: Secondary | ICD-10-CM | POA: Diagnosis not present

## 2021-02-03 LAB — HM DIABETES EYE EXAM

## 2021-02-09 DIAGNOSIS — E559 Vitamin D deficiency, unspecified: Secondary | ICD-10-CM | POA: Diagnosis not present

## 2021-02-09 DIAGNOSIS — N951 Menopausal and female climacteric states: Secondary | ICD-10-CM | POA: Diagnosis not present

## 2021-02-09 DIAGNOSIS — K8681 Exocrine pancreatic insufficiency: Secondary | ICD-10-CM | POA: Diagnosis not present

## 2021-02-09 DIAGNOSIS — E785 Hyperlipidemia, unspecified: Secondary | ICD-10-CM | POA: Diagnosis not present

## 2021-02-09 DIAGNOSIS — E109 Type 1 diabetes mellitus without complications: Secondary | ICD-10-CM | POA: Diagnosis not present

## 2021-02-09 DIAGNOSIS — E039 Hypothyroidism, unspecified: Secondary | ICD-10-CM | POA: Diagnosis not present

## 2021-02-09 DIAGNOSIS — E538 Deficiency of other specified B group vitamins: Secondary | ICD-10-CM | POA: Diagnosis not present

## 2021-02-10 DIAGNOSIS — E10649 Type 1 diabetes mellitus with hypoglycemia without coma: Secondary | ICD-10-CM | POA: Insufficient documentation

## 2021-02-10 DIAGNOSIS — K8681 Exocrine pancreatic insufficiency: Secondary | ICD-10-CM | POA: Diagnosis not present

## 2021-02-10 DIAGNOSIS — E039 Hypothyroidism, unspecified: Secondary | ICD-10-CM | POA: Insufficient documentation

## 2021-02-10 DIAGNOSIS — N951 Menopausal and female climacteric states: Secondary | ICD-10-CM | POA: Diagnosis not present

## 2021-02-10 NOTE — Patient Instructions (Signed)
°  HEALTH MAINTENANCE RECOMMENDATIONS:  It is recommended that you get at least 30 minutes of aerobic exercise at least 5 days/week (for weight loss, you may need as much as 60-90 minutes). This can be any activity that gets your heart rate up. This can be divided in 10-15 minute intervals if needed, but try and build up your endurance at least once a week.  Weight bearing exercise is also recommended twice weekly.  Eat a healthy diet with lots of vegetables, fruits and fiber.  "Colorful" foods have a lot of vitamins (ie green vegetables, tomatoes, red peppers, etc).  Limit sweet tea, regular sodas and alcoholic beverages, all of which has a lot of calories and sugar.  Up to 1 alcoholic drink daily may be beneficial for women (unless trying to lose weight, watch sugars).  Drink a lot of water.  Calcium recommendations are 1200-1500 mg daily (1500 mg for postmenopausal women or women without ovaries), and vitamin D 1000 IU daily.  This should be obtained from diet and/or supplements (vitamins), and calcium should not be taken all at once, but in divided doses.  Monthly self breast exams and yearly mammograms for women over the age of 24 is recommended.  Sunscreen of at least SPF 30 should be used on all sun-exposed parts of the skin when outside between the hours of 10 am and 4 pm (not just when at beach or pool, but even with exercise, golf, tennis, and yard work!)  Use a sunscreen that says "broad spectrum" so it covers both UVA and UVB rays, and make sure to reapply every 1-2 hours.  Remember to change the batteries in your smoke detectors when changing your clock times in the spring and fall. Carbon monoxide detectors are recommended for your home.  Use your seat belt every time you are in a car, and please drive safely and not be distracted with cell phones and texting while driving.  I recommend getting the new shingles vaccine (Shingrix). You may want to check with your insurance to verify what  your out of pocket cost may be (usually covered as preventative, but better to verify to avoid any surprises, as this vaccine is expensive), and then schedule a nurse visit at our office when convenient (based on the possible side effects as discussed).   This is a series of 2 injections, spaced 2 months apart.  It doesn't have to be exactly 2 months apart (but can't be sooner), if that isn't feasible for your schedule, but try and get them close to 2 months (and definitely within 6 months of each other, or else the efficacy of the vaccine drops off). This should be separated from other vaccines by at least 2 weeks.

## 2021-02-10 NOTE — Progress Notes (Signed)
Chief Complaint  Patient presents with   Annual Exam    Fasting annual, no pap-saw Dr Helane Rima in December. Just had eye exam with Dr. Luberta Mutter in December(I will get). Could not give urine, will give on way out. Diagnosed with exocrine pancreatic deficiency by Dr. Loni Muse at [facility]. Asking for xanax rx having some anxiety.     Briana Lopez is a 56 y.o. female who presents for a complete physical.    She had COVID 12/27. Took vitamins, no antiviral meds. Denies any lingering symptoms other than some mild fatigue.  She has some anxiety periodically (with flying, prior to MRI).  1/2 xanax is effective.  She is requesting a refill. She got #10 in September from Dr. Roberto Scales.  Last filled by me 02/05/20 #20. She may have a few left. Has trips, flying, and anxious about MRI.  She saw GI at [facility] in 01/2021 with complaint of several month history of watery diarrhea.  Labs 01/08/21: Fecal pancreatic elastase was low--diagnosed with exocrine pancreatic insufficiency. Remainder of workup was normal: Stool pathogens negative Negative celiac panel Cbc was normal. TSH 2.944 Vitamin D-OH 35.6 C-met normal (x glu 142)  Stools are weird smelling, and are watery with undigested food. No blood or mucus. Denies any significant abdominal pain (rare gas pain), denies weight loss.  CT abdomen 01/28/21: IMPRESSION: -- Mild nonspecific prominence of the pancreatic duct without frank dilatation. However, no obstructing stone or mass lesion identified. -- Mildly thickened gastric antrum and pylorus, which is nonspecific, however can be seen in the setting of gastritis. -- Questioned filling defect in a branch of the SMV extending to the level of the portal venous confluence. This may reflect mixing artifact, however evaluation is somewhat limited due to overall contrast bolus timing. There is no definite associated bowel ischemia or inflammation, although there is nonspecific mild hyperemia of the colonic walls.    She is scheduled to have an MRI, and has f/u with GI tomorrow. She has not been prescribed any medications yet.  She continues to see her endocrinologist regularly for her diabetes care and hypothyroidism.  She last saw Dr. Roberto Scales in October. A1c was 6.5% Care Everywhere results/notes were reviewed.  She is on the new Omnipod 5 (closed loop system, communicates with her Dexcom) Rare hypoglycemia. In range 80% of the time, more high than low (related to holidays).  No significant lows. She checks feet regularly without concerns. She had diabetic eye exam 01/2020, diabetic retinopathy noted.  She went again last week, no results received yet.  Reports unchanged. She denies any vision changes.   Hypothyroidism: She denies any thyroid-related complaints (other than loose stools per above). Last TSH here was normal, but also had checked again through GI at [facility] in 01/2021, normal at 2.944. Lab Results  Component Value Date   TSH 2.800 08/24/2020     Hyperlipidemia:  She reports compliance with Crestor 10 mg daily, and denies side effects. Dose had been increased from 31m to 190min June 2021, when LDL was 129. Lipids were at goal on last check. Due for recheck. Lab Results  Component Value Date   CHOL 168 11/01/2019   HDL 71 11/01/2019   LDLCALC 87 11/01/2019   TRIG 48 11/01/2019   CHOLHDL 2.4 11/01/2019   Menopausal symptoms:   [place] Sympt[place].  She had abnormal vaginal bleeding (with elevated FSH) and was referred back to Dr. GrHelane Rimahad [place] dose was  increased, and she hasn't had any bleeding since then).  She reports she continues to see Dr. Helane Rima yearly, had pap smear recently, and gets her hormone rx from Dr. Helane Rima now (not from doctor in [place]).  Seeing functional medicine and acupuncture doctor in [place] due to her diarrhea.  She said "spleen was weak" and after acupuncture the vaginal bleeding completely stopped.  Diarrhea wasn't  helped.  Diarrhea started in May (after eating more fatty foods in [place]), and hasn't stopped. Diarrhea is much worse after fatty foods.  Immunization History  Administered Date(s) Administered   Hepatitis A, Adult 05/16/2014, 02/04/2019   Influenza Split 01/06/2016, 12/16/2017   Influenza, Quadrivalent, Recombinant, Inj, Pf 12/16/2017   Influenza, Seasonal, Injecte, Preservative Fre 11/08/2010   Influenza,inj,Quad PF,6+ Mos 09/25/2014, 01/06/2016, 03/03/2017, 11/11/2019   Influenza-Unspecified 11/07/2018, 10/30/2020   Moderna Covid-19 Vaccine Bivalent Booster 13yr & up 10/30/2020   Moderna Sars-Covid-2 Vaccination 04/19/2019, 05/16/2019, 01/10/2020, 06/10/2020   Pneumococcal Polysaccharide-23 03/03/2003, 01/25/2018   Tdap 04/05/2010, 05/16/2014   Typhoid Inactivated 05/16/2014   Zoster Recombinat (Shingrix) 02/11/2021   Last Pap smear: 01/2019, normal, no high risk HPV; she had a recent pap with Dr. GHelane Rima Last mammogram: 11/2020 Last colonoscopy: 12/2017, hyperplastic polyps Last DEXA: T-1.5 L fem neck 10/2019 (Dr. GHelane Rima Dentist: 2 times/yr Ophtho: yearly Exercise: 2 days High intensity cardio (HIIT or Peloton workout); 3x/week cardio with strength training; 2 days of yoga or walking. [place] for an hour 3x/week with friends. Walks dog daily for an hour. Golfing with her husband (walks the course at the beach).   Normal vitamin D level (46.1) in 01/2019; 35.6 in 01/2021.  PMH, PSH, SH reviewed  Currently dry January.  Typically drinks Thursday through Saturday, and cut back on intake (1 tequila, and 1-2 glasses of wine.  [place] more [place] friends).  Outpatient Encounter Medications as of 02/11/2021  Medication Sig Note   acetone, urine, test strip 1 strip.    cholecalciferol (VITAMIN D3) 25 MCG (1000 UNIT) tablet Take 1,000 Units by mouth daily.    dapagliflozin propanediol (FARXIGA) 5 MG TABS tablet Take by mouth.     estradiol (CLIMARA - DOSED IN MG/24 HR) 0.025 mg/24hr patch Place 0.025 mg onto the skin once a week.    insulin aspart (NOVOLOG) 100 UNIT/ML injection Inject into the skin 3 (three) times daily before meals. 12/18/2015: Insulin pump...continuous    Insulin Syringe-Needle U-100 31G X 15/64" 0.3 ML MISC 4-5 per day    Lancets MISC 1 each by Other route Six (6) times a day.    levothyroxine (SYNTHROID) 25 MCG tablet Take 1 tablet (25 mcg total) by mouth daily before breakfast.    NON FORMULARY Take 2 capsules by mouth daily. 02/05/2020: BCAA's (amino acid for muscle recovery)   progesterone (PROMETRIUM) 200 MG capsule Take 200 mg by mouth daily.    QUERCETIN PO Take 1 capsule by mouth daily.    rosuvastatin (CRESTOR) 10 MG tablet Take 1 tablet (10 mg total) by mouth daily.    ZINC PICOLINATE PO Take 1 capsule by mouth daily.    ALPRAZolam (XANAX) 0.5 MG tablet Take 0.5-1 tablets (0.25-0.5 mg total) by mouth 3 (three) times daily as needed for anxiety (for flying). (Patient not taking: Reported on 02/11/2021)    Ascorbic Acid (VITAMIN C) 1000 MG tablet Take 1,000 mg by mouth daily. (Patient not taking: Reported on 02/11/2021)    Calcium Carbonate (CALCIUM 600 PO) Take 1 tablet by mouth  daily. (Patient not taking: Reported on 02/11/2021)    COENZYME Q10 PO Take 1 tablet by mouth daily. (Patient not taking: Reported on 02/11/2021)    glucagon 1 MG injection Inject into the muscle. (Patient not taking: Reported on 11/11/2019)    ibuprofen (ADVIL,MOTRIN) 200 MG tablet Take 800 mg by mouth every 8 (eight) hours as needed for headache or cramping (pain). (Patient not taking: Reported on 11/11/2019)    insulin detemir (LEVEMIR) 100 UNIT/ML FlexPen 9 units twice a day. May increase as needed to keep fasting glucose between 70 and 130. Max dose 40 units daily (Patient not taking: No sig reported) 01/25/2018: For emergencies if pump fails   insulin lispro (HUMALOG) 100 UNIT/ML KwikPen Inject into the skin. (Patient not  taking: Reported on 11/11/2019) 01/25/2018: In case needed if pump stops working   loperamide (IMODIUM A-D) 2 MG tablet Take 2 mg by mouth as needed for diarrhea or loose stools. (Patient not taking: Reported on 11/11/2019)    Lysine 500 MG CAPS Take 500 mg by mouth daily. (Patient not taking: Reported on 02/11/2021)    magnesium gluconate (MAGONATE) 500 MG tablet Take 500 mg by mouth daily. (Patient not taking: Reported on 02/11/2021)    VITAMIN E PO Take 2 capsules by mouth daily. (Patient not taking: Reported on 02/11/2021)    [DISCONTINUED] Biotin 1000 MCG CHEW Chew 1 each by mouth daily. (Patient not taking: Reported on 02/11/2021)    [DISCONTINUED] Bromelains 500 MG TABS Take 1 tablet by mouth daily. 02/05/2020: (digestive enzyme, per pt)   [DISCONTINUED] N-ACETYL CYSTEINE PO Take 1 tablet by mouth daily. (Patient not taking: Reported on 02/11/2021)    [DISCONTINUED] Selenium 200 MCG CAPS Take 1 capsule by mouth daily.    [DISCONTINUED] Thiamine HCl (VITAMIN B-1) 250 MG tablet Take 250 mg by mouth daily.    No facility-administered encounter medications on file as of 02/11/2021.   Stopped vitamins related to diarrhea, restarted some when she had COVID.  Allergies  Allergen Reactions   Penicillins     As a child  Has patient had a PCN reaction causing immediate rash, facial/tongue/throat swelling, SOB or lightheadedness with hypotension: unknown Has patient had a PCN reaction causing severe rash involving mucus membranes or skin necrosis: unknown Has patient had a PCN reaction that required hospitalization: unknown Has patient had a PCN reaction occurring within the last 10 years: no If all of the above answers are "NO", then may proceed with Cephalosporin use.    ROS: The patient denies anorexia, fever, weight changes, headaches,  vision changes, decreased hearing, ear pain, sore throat, breast concerns, chest pain, palpitations, dizziness, syncope, dyspnea on exertion, cough, swelling, nausea,  vomiting, abdominal pain, melena, hematochezia, indigestion/heartburn, hematuria, incontinence, dysuria, genital lesions, vaginal dryness,joint pains; no numbness, tingling, weakness, tremor, suspicious skin lesions, depression, anxiety, abnormal bleeding/bruising, or enlarged lymph nodes. No vaginal bleeding. No night sweats or hot flashes. Rare pain in R foot, r/b accupuncture, no other neuropathy symptoms. Moods are overall good. Some anxiety, sees therapist regularly. Denies depression. Husband reports snoring. No daytime somnolence (other than from recent COVID) or unrefreshed sleep. Diarrhea per HPI No weight changes.   PHYSICAL EXAM:  BP 120/70    Pulse 76    Ht '5\' 5"'  (1.651 m)    Wt 123 lb (55.8 kg)    LMP 11/06/2019    BMI 20.47 kg/m   Wt Readings from Last 3 Encounters:  02/11/21 123 lb (55.8 kg)  02/05/20 121 lb 12.8 oz (  55.2 kg)  11/11/19 122 lb (55.3 kg)    General Appearance:     Alert, cooperative, no distress, appears stated age. Pt declined to change into gown for visit today   Head:     Normocephalic, without obvious abnormality, atraumatic.  Eyes:     PERRL, conjunctiva/corneas clear, EOM's intact, fundi benign.   Ears:     Normal TM's and external ear canals.   Nose:    Not examined, wearing mask due to COVID-19 pandemic  Throat:    Not examined, wearing mask due to COVID-19 pandemic  Neck:    Supple, no lymphadenopathy;  thyroid:  no enlargement/ tenderness/nodules; no carotid bruit or JVD   Back:     Spine nontender, no curvature, ROM normal, no CVA     tenderness   Lungs:      Clear to auscultation bilaterally without wheezes, rales or     ronchi; respirations unlabored   Chest Wall:     No tenderness or deformity    Heart:     Regular rate and rhythm, S1 and S2 normal, no murmur, rub   or gallop   Breast Exam:     Deferred to GYN.   Abdomen:      Soft, non-tender, nondistended, normoactive bowel sounds,     no masses, no hepatosplenomegaly   Genitalia:      Deferred to GYN  Rectal:     Deferred   Extremities:    No clubbing, cyanosis or edema. Normal monofilament exam  Pulses:    2+ and symmetric all extremities   Skin:    Skin color, texture, turgor normal, no rashes or lesions though exam is very limited today (not undressed; sees GYN)  Lymph nodes:    Cervical, supraclavicular nodes normal   Neurologic:    Normal strength, sensation and gait; reflexes 2+ and symmetric throughout                            Psych:   Normal mood, affect, hygiene and grooming.    Normal diabetic foot exam.   ASSESSMENT/PLAN:  Annual physical exam  Type 1 diabetes mellitus with hypoglycemia and without coma ([place]) - well controllec, managed by endo - Plan: Microalbumin / creatinine urine ratio  Pure hypercholesterolemia - cont statin, due for recheck - Plan: Lipid panel  Hypothyroidism, unspecified type - adequately replaced  Exocrine pancreatic insufficiency - has f/u with GI tomorrow.  Discussed poss treatments.  MRI ordered. 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 Risks/SE reviewed - Plan: ALPRAZolam (XANAX) 0.5 MG tablet   Urine microalbumin, lipids  Prev discussed ACEI/ARB--endo doesn't have her on; BP has been good, microalbumin has been negative. Due for recheck today.   Discussed monthly self breast exams and yearly mammograms; at least 30 minutes of aerobic activity at least 5 days/week, weight bearing exercise at least 2x/wk; proper sunscreen use reviewed; healthy diet, including goals of calcium and vitamin D intake and alcohol recommendations (less than or equal to 1 drink/day--she was encouraged to cut back) reviewed; regular seatbelt use; changing batteries in smoke detectors, carbon monoxide detectors.  Immunization recommendations discussed--continue yearly flu shots. Shingrix recommended--risks/SE reviewed,  given today. Return in 2 mos (will be later, when back in town in April) for #2. Colonoscopy recommendations reviewed--UTD, due again 12/2027.  F/u 1 year, sooner prn.

## 2021-02-11 ENCOUNTER — Encounter: Payer: Self-pay | Admitting: *Deleted

## 2021-02-11 ENCOUNTER — Ambulatory Visit (INDEPENDENT_AMBULATORY_CARE_PROVIDER_SITE_OTHER): Payer: BC Managed Care – PPO | Admitting: Family Medicine

## 2021-02-11 ENCOUNTER — Encounter: Payer: Self-pay | Admitting: Family Medicine

## 2021-02-11 ENCOUNTER — Other Ambulatory Visit: Payer: Self-pay

## 2021-02-11 VITALS — BP 120/70 | HR 76 | Ht 65.0 in | Wt 123.0 lb

## 2021-02-11 DIAGNOSIS — E039 Hypothyroidism, unspecified: Secondary | ICD-10-CM

## 2021-02-11 DIAGNOSIS — Z23 Encounter for immunization: Secondary | ICD-10-CM | POA: Diagnosis not present

## 2021-02-11 DIAGNOSIS — K8681 Exocrine pancreatic insufficiency: Secondary | ICD-10-CM | POA: Diagnosis not present

## 2021-02-11 DIAGNOSIS — E7800 Pure hypercholesterolemia, unspecified: Secondary | ICD-10-CM | POA: Diagnosis not present

## 2021-02-11 DIAGNOSIS — E10649 Type 1 diabetes mellitus with hypoglycemia without coma: Secondary | ICD-10-CM | POA: Diagnosis not present

## 2021-02-11 DIAGNOSIS — Z0000 Encounter for general adult medical examination without abnormal findings: Secondary | ICD-10-CM | POA: Diagnosis not present

## 2021-02-11 DIAGNOSIS — F419 Anxiety disorder, unspecified: Secondary | ICD-10-CM

## 2021-02-11 MED ORDER — ALPRAZOLAM 0.5 MG PO TABS: 0.2500 mg | ORAL_TABLET | Freq: Three times a day (TID) | ORAL | 0 refills | Status: DC | PRN

## 2021-02-12 DIAGNOSIS — K8681 Exocrine pancreatic insufficiency: Secondary | ICD-10-CM | POA: Diagnosis not present

## 2021-02-12 DIAGNOSIS — Z6820 Body mass index (BMI) 20.0-20.9, adult: Secondary | ICD-10-CM | POA: Diagnosis not present

## 2021-02-12 LAB — MICROALBUMIN / CREATININE URINE RATIO
Creatinine, Urine: 31.9 mg/dL
Microalb/Creat Ratio: <9 mg/g creat (ref 0–29)
Microalbumin, Urine: <3 ug/mL

## 2021-02-12 LAB — LIPID PANEL
Chol/HDL Ratio: 3.1 ratio (ref 0.0–4.4)
Cholesterol, Total: 201 mg/dL — ABNORMAL HIGH (ref 100–199)
HDL: 64 mg/dL (ref >39)
LDL Chol Calc (NIH): 123 mg/dL — ABNORMAL HIGH (ref 0–99)
Triglycerides: 75 mg/dL (ref 0–149)
VLDL Cholesterol Cal: 14 mg/dL (ref 5–40)

## 2021-02-13 NOTE — Progress Notes (Signed)
Please advise pt of results--the LDL is >100. Verify with her that she is taking rosuvastatin 10mg  daily (and if not, why).  If truly taking 10mg  daily with no missed doses, then needs to be increased to 20mg ; goal is LDL<100.  This will need to be followed up on sooner than a year (I think in the past she had inadvertently gotten back on 5mg , so please verify with her what she has been taking or not taking). Thanks Urine test was normal.

## 2021-02-18 ENCOUNTER — Other Ambulatory Visit: Payer: Self-pay | Admitting: Family Medicine

## 2021-02-18 DIAGNOSIS — E7800 Pure hypercholesterolemia, unspecified: Secondary | ICD-10-CM

## 2021-02-18 MED ORDER — ROSUVASTATIN CALCIUM 10 MG PO TABS: 10.0000 mg | ORAL_TABLET | Freq: Every day | ORAL | 1 refills | Status: DC

## 2021-03-01 DIAGNOSIS — R932 Abnormal findings on diagnostic imaging of liver and biliary tract: Secondary | ICD-10-CM | POA: Diagnosis not present

## 2021-03-01 DIAGNOSIS — K3189 Other diseases of stomach and duodenum: Secondary | ICD-10-CM | POA: Diagnosis not present

## 2021-03-01 DIAGNOSIS — K8689 Other specified diseases of pancreas: Secondary | ICD-10-CM | POA: Diagnosis not present

## 2021-03-01 DIAGNOSIS — K2950 Unspecified chronic gastritis without bleeding: Secondary | ICD-10-CM | POA: Diagnosis not present

## 2021-03-09 DIAGNOSIS — K8681 Exocrine pancreatic insufficiency: Secondary | ICD-10-CM | POA: Diagnosis not present

## 2021-03-09 DIAGNOSIS — E109 Type 1 diabetes mellitus without complications: Secondary | ICD-10-CM | POA: Diagnosis not present

## 2021-03-09 DIAGNOSIS — E559 Vitamin D deficiency, unspecified: Secondary | ICD-10-CM | POA: Diagnosis not present

## 2021-03-09 DIAGNOSIS — E039 Hypothyroidism, unspecified: Secondary | ICD-10-CM | POA: Diagnosis not present

## 2021-03-12 DIAGNOSIS — Z6820 Body mass index (BMI) 20.0-20.9, adult: Secondary | ICD-10-CM | POA: Diagnosis not present

## 2021-03-12 DIAGNOSIS — K8681 Exocrine pancreatic insufficiency: Secondary | ICD-10-CM | POA: Diagnosis not present

## 2021-05-19 ENCOUNTER — Other Ambulatory Visit (INDEPENDENT_AMBULATORY_CARE_PROVIDER_SITE_OTHER): Payer: BC Managed Care – PPO

## 2021-05-19 DIAGNOSIS — E7800 Pure hypercholesterolemia, unspecified: Secondary | ICD-10-CM | POA: Diagnosis not present

## 2021-05-19 DIAGNOSIS — Z23 Encounter for immunization: Secondary | ICD-10-CM | POA: Diagnosis not present

## 2021-05-20 LAB — LIPID PANEL
Chol/HDL Ratio: 2.5 ratio (ref 0.0–4.4)
Cholesterol, Total: 168 mg/dL (ref 100–199)
HDL: 67 mg/dL (ref >39)
LDL Chol Calc (NIH): 90 mg/dL (ref 0–99)
Triglycerides: 53 mg/dL (ref 0–149)
VLDL Cholesterol Cal: 11 mg/dL (ref 5–40)

## 2021-05-28 DIAGNOSIS — Z6820 Body mass index (BMI) 20.0-20.9, adult: Secondary | ICD-10-CM | POA: Diagnosis not present

## 2021-05-28 DIAGNOSIS — R197 Diarrhea, unspecified: Secondary | ICD-10-CM | POA: Diagnosis not present

## 2021-05-28 DIAGNOSIS — K8681 Exocrine pancreatic insufficiency: Secondary | ICD-10-CM | POA: Diagnosis not present

## 2021-06-03 DIAGNOSIS — N951 Menopausal and female climacteric states: Secondary | ICD-10-CM | POA: Diagnosis not present

## 2021-06-03 DIAGNOSIS — E109 Type 1 diabetes mellitus without complications: Secondary | ICD-10-CM | POA: Diagnosis not present

## 2021-06-03 DIAGNOSIS — E039 Hypothyroidism, unspecified: Secondary | ICD-10-CM | POA: Diagnosis not present

## 2021-06-03 DIAGNOSIS — K8681 Exocrine pancreatic insufficiency: Secondary | ICD-10-CM | POA: Diagnosis not present

## 2021-06-09 DIAGNOSIS — E10649 Type 1 diabetes mellitus with hypoglycemia without coma: Secondary | ICD-10-CM | POA: Diagnosis not present

## 2021-06-09 DIAGNOSIS — K8681 Exocrine pancreatic insufficiency: Secondary | ICD-10-CM | POA: Diagnosis not present

## 2021-06-09 DIAGNOSIS — E039 Hypothyroidism, unspecified: Secondary | ICD-10-CM | POA: Diagnosis not present

## 2021-06-09 DIAGNOSIS — R7989 Other specified abnormal findings of blood chemistry: Secondary | ICD-10-CM | POA: Diagnosis not present

## 2021-07-06 DIAGNOSIS — K2980 Duodenitis without bleeding: Secondary | ICD-10-CM | POA: Diagnosis not present

## 2021-07-06 DIAGNOSIS — Z880 Allergy status to penicillin: Secondary | ICD-10-CM | POA: Diagnosis not present

## 2021-07-06 DIAGNOSIS — K52832 Lymphocytic colitis: Secondary | ICD-10-CM | POA: Diagnosis not present

## 2021-07-06 DIAGNOSIS — R197 Diarrhea, unspecified: Secondary | ICD-10-CM | POA: Diagnosis not present

## 2021-07-06 DIAGNOSIS — E039 Hypothyroidism, unspecified: Secondary | ICD-10-CM | POA: Diagnosis not present

## 2021-07-06 DIAGNOSIS — K529 Noninfective gastroenteritis and colitis, unspecified: Secondary | ICD-10-CM | POA: Diagnosis not present

## 2021-07-06 DIAGNOSIS — K648 Other hemorrhoids: Secondary | ICD-10-CM | POA: Diagnosis not present

## 2021-07-06 DIAGNOSIS — E109 Type 1 diabetes mellitus without complications: Secondary | ICD-10-CM | POA: Diagnosis not present

## 2021-08-05 ENCOUNTER — Encounter (HOSPITAL_BASED_OUTPATIENT_CLINIC_OR_DEPARTMENT_OTHER): Payer: Self-pay | Admitting: Emergency Medicine

## 2021-08-05 ENCOUNTER — Emergency Department (HOSPITAL_BASED_OUTPATIENT_CLINIC_OR_DEPARTMENT_OTHER)
Admission: EM | Admit: 2021-08-05 | Discharge: 2021-08-05 | Disposition: A | Discharge status: Home / Self Care | Payer: BC Managed Care – PPO | Attending: Emergency Medicine | Admitting: Emergency Medicine

## 2021-08-05 ENCOUNTER — Other Ambulatory Visit: Payer: Self-pay

## 2021-08-05 ENCOUNTER — Emergency Department (HOSPITAL_BASED_OUTPATIENT_CLINIC_OR_DEPARTMENT_OTHER): Payer: BC Managed Care – PPO

## 2021-08-05 DIAGNOSIS — Z794 Long term (current) use of insulin: Secondary | ICD-10-CM | POA: Diagnosis not present

## 2021-08-05 DIAGNOSIS — N390 Urinary tract infection, site not specified: Secondary | ICD-10-CM | POA: Diagnosis not present

## 2021-08-05 DIAGNOSIS — R109 Unspecified abdominal pain: Secondary | ICD-10-CM | POA: Diagnosis not present

## 2021-08-05 DIAGNOSIS — N949 Unspecified condition associated with female genital organs and menstrual cycle: Secondary | ICD-10-CM

## 2021-08-05 DIAGNOSIS — R112 Nausea with vomiting, unspecified: Secondary | ICD-10-CM | POA: Diagnosis not present

## 2021-08-05 DIAGNOSIS — R1084 Generalized abdominal pain: Secondary | ICD-10-CM | POA: Diagnosis not present

## 2021-08-05 DIAGNOSIS — E109 Type 1 diabetes mellitus without complications: Secondary | ICD-10-CM | POA: Diagnosis not present

## 2021-08-05 DIAGNOSIS — N83202 Unspecified ovarian cyst, left side: Secondary | ICD-10-CM | POA: Insufficient documentation

## 2021-08-05 DIAGNOSIS — R1110 Vomiting, unspecified: Secondary | ICD-10-CM | POA: Diagnosis not present

## 2021-08-05 LAB — CBC WITH DIFFERENTIAL/PLATELET
Abs Immature Granulocytes: 0.05 10*3/uL (ref 0.00–0.07)
Basophils Absolute: 0 10*3/uL (ref 0.0–0.1)
Basophils Relative: 0 %
Eosinophils Absolute: 0 10*3/uL (ref 0.0–0.5)
Eosinophils Relative: 0 %
HCT: 46.1 % — ABNORMAL HIGH (ref 36.0–46.0)
Hemoglobin: 14.6 g/dL (ref 12.0–15.0)
Immature Granulocytes: 0 %
Lymphocytes Relative: 2 %
Lymphs Abs: 0.2 10*3/uL — ABNORMAL LOW (ref 0.7–4.0)
MCH: 28.2 pg (ref 26.0–34.0)
MCHC: 31.7 g/dL (ref 30.0–36.0)
MCV: 89.2 fL (ref 80.0–100.0)
Monocytes Absolute: 0.3 10*3/uL (ref 0.1–1.0)
Monocytes Relative: 3 %
Neutro Abs: 10.7 10*3/uL — ABNORMAL HIGH (ref 1.7–7.7)
Neutrophils Relative %: 95 %
Platelets: 277 10*3/uL (ref 150–400)
RBC: 5.17 MIL/uL — ABNORMAL HIGH (ref 3.87–5.11)
RDW: 14.8 % (ref 11.5–15.5)
WBC: 11.3 10*3/uL — ABNORMAL HIGH (ref 4.0–10.5)
nRBC: 0 % (ref 0.0–0.2)

## 2021-08-05 LAB — URINALYSIS, ROUTINE W REFLEX MICROSCOPIC
Bilirubin Urine: NEGATIVE
Glucose, UA: >1000 mg/dL — AB
Hgb urine dipstick: NEGATIVE
Ketones, ur: >80 mg/dL — AB
Leukocytes,Ua: NEGATIVE
Nitrite: POSITIVE — AB
Specific Gravity, Urine: 1.04 — ABNORMAL HIGH (ref 1.005–1.030)
pH: 5.5 (ref 5.0–8.0)

## 2021-08-05 LAB — COMPREHENSIVE METABOLIC PANEL
ALT: 18 U/L (ref 0–44)
AST: 18 U/L (ref 15–41)
Albumin: 4.3 g/dL (ref 3.5–5.0)
Alkaline Phosphatase: 42 U/L (ref 38–126)
Anion gap: 11 (ref 5–15)
BUN: 27 mg/dL — ABNORMAL HIGH (ref 6–20)
CO2: 24 mmol/L (ref 22–32)
Calcium: 9.6 mg/dL (ref 8.9–10.3)
Chloride: 104 mmol/L (ref 98–111)
Creatinine, Ser: 0.86 mg/dL (ref 0.44–1.00)
GFR, Estimated: >60 mL/min (ref >60)
Glucose, Bld: 141 mg/dL — ABNORMAL HIGH (ref 70–99)
Potassium: 3.9 mmol/L (ref 3.5–5.1)
Sodium: 139 mmol/L (ref 135–145)
Total Bilirubin: 0.9 mg/dL (ref 0.3–1.2)
Total Protein: 7.2 g/dL (ref 6.5–8.1)

## 2021-08-05 LAB — CBG MONITORING, ED: Glucose-Capillary: 136 mg/dL — ABNORMAL HIGH (ref 70–99)

## 2021-08-05 LAB — LIPASE, BLOOD: Lipase: 21 U/L (ref 11–51)

## 2021-08-05 MED ORDER — LACTATED RINGERS IV BOLUS
1000.0000 mL | Freq: Once | INTRAVENOUS | Status: AC
  Administered 2021-08-05: 1000 mL via INTRAVENOUS

## 2021-08-05 MED ORDER — IOHEXOL 300 MG/ML  SOLN
100.0000 mL | Freq: Once | INTRAMUSCULAR | Status: AC | PRN
  Administered 2021-08-05: 100 mL via INTRAVENOUS

## 2021-08-05 MED ORDER — CEPHALEXIN 500 MG PO CAPS: 500.0000 mg | ORAL_CAPSULE | Freq: Three times a day (TID) | ORAL | 0 refills | Status: AC

## 2021-08-05 MED ORDER — PROMETHAZINE HCL 25 MG RE SUPP: 25.0000 mg | Freq: Four times a day (QID) | RECTAL | 0 refills | Status: AC | PRN

## 2021-08-05 MED ORDER — PROMETHAZINE HCL 25 MG PO TABS: 25.0000 mg | ORAL_TABLET | Freq: Four times a day (QID) | ORAL | 0 refills | Status: AC | PRN

## 2021-08-05 MED ORDER — ONDANSETRON HCL 4 MG/2ML IJ SOLN
4.0000 mg | Freq: Once | INTRAMUSCULAR | Status: AC
  Administered 2021-08-05: 4 mg via INTRAVENOUS
  Filled 2021-08-05: qty 2

## 2021-08-05 NOTE — ED Triage Notes (Signed)
Pt started vomiting /abd pain at 0830

## 2021-08-05 NOTE — ED Notes (Signed)
Pt's PCP called advising this RN patient started a new medication for Type 1 diabetes. PCP reports pt's CBG can be normal but pt could be in "diabetic ketoacidosis"  He is concerned for possible ICU admit.  Dr. Roberto Scales is available via telephone until 8pm  [phone]

## 2021-08-05 NOTE — ED Provider Notes (Signed)
[facility] EMERGENCY DEPT Provider Note   CSN: [id number] Arrival date & time: 08/05/21  1246     History  Chief Complaint  Patient presents with   Abdominal Pain    Briana Lopez is a 56 y.o. female.  HPI     830AM started vomiting Began suddenly this AM, vomited probably 100 times since this morning Had emailed endocrinologist about getting phenergan Has chronic loose stool/diarrhea not changed Had vomiting like this 6 years ago Had 2 endoscopies in last 2 months due to diarrhea/pancreas, had microscopic colitis, on budesonide for 3 weeks Was having abdominal pain with it, thinks from vomiting so much. Feels sore.  Got zofran and now improved.  Left side feels a little tender.  Did a lot of ab work out yesterday No chest pain or dyspnea. No urinary symptoms.  Drinks etoh, had 3 drinks last night. CBD gummies, no drug use  Past Medical History:  Diagnosis Date   Anxiety    Diabetes mellitus type 1 (HCC) 2000   Dr. Roberto Scales [facility])   Migraines    with pregnancy   Pure hypercholesterolemia    Thyroid disease     Past Surgical History:  Procedure Laterality Date   FACIAL COSMETIC SURGERY  12/10/2019   Dr. Araceli Bouche in [place] Medications Prior to Admission medications   Medication Sig Start Date End Date Taking? Authorizing Provider  cephALEXin (KEFLEX) 500 MG capsule Take 1 capsule (500 mg total) by mouth 3 (three) times daily for 7 days. 08/05/21 08/12/21 Yes Gareth Morgan, MD  promethazine (PHENERGAN) 25 MG suppository Place 1 suppository (25 mg total) rectally every 6 (six) hours as needed for nausea or vomiting. 08/05/21  Yes Gareth Morgan, MD  promethazine (PHENERGAN) 25 MG tablet Take 1 tablet (25 mg total) by mouth every 6 (six) hours as needed for nausea or vomiting. 08/05/21  Yes Gareth Morgan, MD  acetone, urine, test strip 1 strip. 03/03/17   [provider]  ALPRAZolam Duanne Moron) 0.5 MG tablet Take 0.5-1 tablets  (0.25-0.5 mg total) by mouth 3 (three) times daily as needed for anxiety (for flying). 02/11/21   Rita Ohara, MD  cholecalciferol (VITAMIN D3) 25 MCG (1000 UNIT) tablet Take 1,000 Units by mouth daily.    [provider]  dapagliflozin propanediol (FARXIGA) 5 MG TABS tablet Take by mouth. 09/04/19   [provider]  estradiol (CLIMARA - DOSED IN MG/24 HR) 0.025 mg/24hr patch Place 0.025 mg onto the skin once a week. 09/18/19   [provider]  insulin aspart (NOVOLOG) 100 UNIT/ML injection Inject into the skin 3 (three) times daily before meals.    [provider]  insulin detemir (LEVEMIR) 100 UNIT/ML FlexPen 9 units twice a day. May increase as needed to keep fasting glucose between 70 and 130. Max dose 40 units daily Patient not taking: No sig reported 03/01/17   [provider]  insulin lispro (HUMALOG) 100 UNIT/ML KwikPen Inject into the skin. Patient not taking: Reported on 11/11/2019    [provider]  Insulin Syringe-Needle U-100 31G X 15/64" 0.3 ML MISC 4-5 per day 03/01/17   [provider]  Lancets MISC 1 each by Other route Six (6) times a day. 03/01/17   [provider]  levothyroxine (SYNTHROID) 25 MCG tablet Take 1 tablet (25 mcg total) by mouth daily before breakfast. 08/07/20   Rita Ohara, MD  NON FORMULARY Take 2 capsules by mouth daily.    [provider]  progesterone (PROMETRIUM) 200 MG capsule Take 200 mg by mouth daily.    [provider]  QUERCETIN PO Take 1 capsule by mouth daily.    [provider]  rosuvastatin (CRESTOR) 10 MG tablet Take 1 tablet (10 mg total) by mouth daily. 02/18/21   Rita Ohara, MD  ZINC PICOLINATE PO Take 1 capsule by mouth daily.    [provider]      Allergies    Penicillins    Review of Systems   Review of Systems  Physical Exam Updated Vital Signs BP 111/60   Pulse 78   Temp 97.9 F (36.6 C)   Resp 16   LMP 11/06/2019   SpO2 100%   Physical Exam Vitals and nursing note reviewed.  Constitutional:      General: She is not in acute distress.    Appearance: She is well-developed. She is not diaphoretic.  HENT:     Head: Normocephalic and atraumatic.  Eyes:     Conjunctiva/sclera: Conjunctivae normal.  Cardiovascular:     Rate and Rhythm: Normal rate and regular rhythm.     Heart sounds: Normal heart sounds. No murmur heard.    No friction rub. No gallop.  Pulmonary:     Effort: Pulmonary effort is normal. No respiratory distress.     Breath sounds: Normal breath sounds. No wheezing or rales.  Abdominal:     General: There is no distension.     Palpations: Abdomen is soft.     Tenderness: There is abdominal tenderness in the right upper quadrant and left lower quadrant. There is no guarding.  Musculoskeletal:        General: No tenderness.     Cervical back: Normal range of motion.  Skin:    General: Skin is warm and dry.     Findings: No erythema or rash.  Neurological:     Mental Status: She is alert and oriented to person, place, and time.     ED Results / Procedures / Treatments   Labs (all labs ordered are listed, but only abnormal results are displayed) Labs Reviewed  COMPREHENSIVE METABOLIC PANEL - Abnormal; Notable for the following components:      Result Value   Glucose, Bld 141 (*)    BUN 27 (*)    All other components within normal limits  CBC WITH DIFFERENTIAL/PLATELET - Abnormal; Notable for the following components:   WBC 11.3 (*)    RBC 5.17 (*)    HCT 46.1 (*)    Neutro Abs 10.7 (*)    Lymphs Abs 0.2 (*)    All other components within normal limits  URINALYSIS, ROUTINE W REFLEX MICROSCOPIC - Abnormal; Notable for the following components:   Specific Gravity, Urine 1.040 (*)    Glucose, UA >1,000 (*)    Ketones, ur >80 (*)    Protein, ur TRACE (*)    Nitrite POSITIVE (*)    Bacteria, UA RARE (*)    All other components within normal limits  CBG MONITORING, ED - Abnormal;  Notable for the following components:   Glucose-Capillary 136 (*)    All other components within normal limits  URINE CULTURE  LIPASE, BLOOD    EKG None  Radiology CT ABDOMEN PELVIS W CONTRAST  Result Date: 08/05/2021 CLINICAL DATA:  Left lower quadrant pain and vomiting beginning this morning. EXAM: CT ABDOMEN AND PELVIS WITH CONTRAST TECHNIQUE: Multidetector CT imaging of the abdomen and pelvis was performed using the standard protocol following bolus administration  of intravenous contrast. RADIATION DOSE REDUCTION: This exam was performed according to the departmental dose-optimization program which includes automated exposure control, adjustment of the mA and/or kV according to patient size and/or use of iterative reconstruction technique. CONTRAST:  166m OMNIPAQUE IOHEXOL 300 MG/ML  SOLN COMPARISON:  12/18/2015 FINDINGS: Lower Chest: No acute findings. Hepatobiliary: No hepatic masses identified. Gallbladder is unremarkable. No evidence of biliary ductal dilatation. Pancreas:  No mass or inflammatory changes. Spleen: Within normal limits in size and appearance. Adrenals/Urinary Tract: No masses identified. No evidence of ureteral calculi or hydronephrosis. Stomach/Bowel: No evidence of obstruction, inflammatory process or abnormal fluid collections. Vascular/Lymphatic: No pathologically enlarged lymph nodes. No acute vascular findings. Aortic atherosclerotic calcification incidentally noted. Reproductive: Normal appearance of uterus. A benign-appearing cyst is seen in the left adnexa measuring 2.5 x 1.9 cm. No evidence of inflammatory process or free fluid. Other:  None. Musculoskeletal:  No suspicious bone lesions identified. IMPRESSION: 2.5 cm benign-appearing left adnexal cyst. No follow-up imaging recommended. Note: This recommendation does not apply to premenarchal patients and to those with increased risk (genetic, family history, elevated tumor markers or other high-risk factors) of ovarian  cancer. Reference: JACR 2020 Feb; 17(2):248-254 Aortic Atherosclerosis (ICD10-I70.0). Electronically Signed   By: JMarlaine HindM.D.   On: 08/05/2021 18:07    Procedures Procedures    Medications Ordered in ED Medications  ondansetron (ZOFRAN) injection 4 mg (4 mg Intravenous Given 08/05/21 1454)  lactated ringers bolus 1,000 mL (0 mLs Intravenous Stopped 08/05/21 1800)  iohexol (OMNIPAQUE) 300 MG/ML solution 100 mL (100 mLs Intravenous Contrast Given 08/05/21 1735)    ED Course/ Medical Decision Making/ A&P    89yofemale with history of type 1 DM presents with concern for nausea, vomiting and abdominal pain.  DDx includes appendicitis, pancreatitis, cholecystitis, pyelonephritis, nephrolithiasis, diverticulitis, SBO, DKA, gastroparesis.  Labs completed and personally evaluated by me show no anion gap, no acidosis, normal glucose, no sign of DKA or significant electrolyte abnormalities, no pancreatitis, no hepatitis.  UA does show positive nitrites, otherwise negative for infection, does show ketones and glucose-unclear if related to diabetes or not eating/starvation ketosis today-however labs otherwise without signs of DKA.   CT abdomen pelvis shows no acute findings-shows left adnexal cyst and discussed findings/outpatient follow up.   Nausea is significantly improved with nausea medicine in ED.  Will treat for possible UTI given nitrite positive with abx. Given phenergan rx. Patient discharged in stable condition with understanding of reasons to return.            Final Clinical Impression(s) / ED Diagnoses Final diagnoses:  Generalized abdominal pain  Nausea and vomiting, unspecified vomiting type  Adnexal cyst    Rx / DC Orders ED Discharge Orders          Ordered    cephALEXin (KEFLEX) 500 MG capsule  3 times daily        08/05/21 1815    promethazine (PHENERGAN) 25 MG tablet  Every 6 hours PRN        08/05/21 1815    promethazine (PHENERGAN) 25 MG suppository   Every 6 hours PRN        08/05/21 1826              SGareth Morgan MD 08/06/21 1108

## 2021-08-06 DIAGNOSIS — K52832 Lymphocytic colitis: Secondary | ICD-10-CM | POA: Diagnosis not present

## 2021-08-06 DIAGNOSIS — Z6820 Body mass index (BMI) 20.0-20.9, adult: Secondary | ICD-10-CM | POA: Diagnosis not present

## 2021-08-07 LAB — URINE CULTURE: Culture: >=100000 — AB

## 2021-08-08 ENCOUNTER — Telehealth (HOSPITAL_BASED_OUTPATIENT_CLINIC_OR_DEPARTMENT_OTHER): Payer: Self-pay | Admitting: *Deleted

## 2021-08-08 NOTE — Telephone Encounter (Signed)
Post ED Visit - Positive Culture Follow-up  Culture report reviewed by antimicrobial stewardship pharmacist: [facility] Team '[]'$  Nathan Batchelder, Pharm.D. '[]'$  [address], Pharm.D., BCPS AQ-ID '[]'$  Heide Guile, Pharm.D., BCPS '[]'$  [place], Pharm.D., BCPS '[]'$  [place], Pharm.D., BCPS, AAHIVP '[]'$  [place], Pharm.D., BCPS, AAHIVP '[]'$  Legrand Como, PharmD, BCPS '[]'$  Salome Arnt, PharmD, BCPS '[]'$  Johnnette Gourd, PharmD, BCPS '[]'$  Hughes Better, PharmD '[]'$  Leeroy Cha, PharmD, BCPS '[x]'$  Laqueta Linden,, PharmD  [place] Team '[]'$  [address], PharmD '[]'$  Leodis Sias, PharmD '[]'$  Lindell Spar, PharmD '[]'$  Royetta Asal, Rph '[]'$  Graylin Shiver) Rema Fendt, PharmD '[]'$  Glennon Mac, PharmD '[]'$  Arlyn Dunning, PharmD '[]'$  Netta Cedars, PharmD '[]'$  Dia Sitter, PharmD '[]'$  Leone Haven, PharmD '[]'$  Gretta Arab, PharmD '[]'$  Theodis Shove, PharmD '[]'$  Peggyann Juba, PharmD   Positive urine culture Treated with Cephalexin, organism sensitive to the same and no further patient follow-up is required at this time.  Briana Lopez 08/08/2021, 2:59 PM

## 2021-09-10 ENCOUNTER — Telehealth: Payer: Self-pay | Admitting: Family Medicine

## 2021-09-10 NOTE — Telephone Encounter (Signed)
Called and left a message for pt to call back and schedule.

## 2021-09-10 NOTE — Telephone Encounter (Signed)
Pt called and states that next month she is going to [place] to [place] [facility]. It has been recommended that she have altitude sickness medicine on hand. She plans on following CDC recommendations as far as immunizations and [facility] She is requesting altitude sickness med be sent into [facility]. PT can be reached at [phone].

## 2021-09-10 NOTE — Telephone Encounter (Signed)
Needs OV to discuss meds, side effects. Can be virtual if that is easier for her

## 2021-09-15 NOTE — Progress Notes (Unsigned)
Start time: End time:  Virtual Visit via Video Note  I connected with Briana Lopez on 09/15/21 by a video enabled telemedicine application and verified that I am speaking with the correct person using two identifiers.  Location: Patient: *** Provider: office   I discussed the limitations of evaluation and management by telemedicine and the availability of in person appointments. The patient expressed understanding and agreed to proceed.  History of Present Illness:  No chief complaint on file.  next month she is going to [place] to [place] [facility]. It has been recommended that she have altitude sickness medicine on hand. She plans on following CDC recommendations as far as immunizations and [facility]. 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 Joene started budesonide '9mg'$  in June and had further improvement in her bowel movement frequency and consistency.    Observations/Objective:  LMP 11/06/2019    Assessment and Plan:  Prevention is slow ascension. Take rest days, climb to a higher altitude during the day, sleep at lower altitude at night. Stay well hydrated (4-5 liters of fluid daily), and eat high calorie diet even if appetite is low. Avoid alcohol, sleeping pills.   Diamox--can be used for prevention and treatment. '250mg'$  every 8-12 hours before and during rapid ascent to altitude  (start 24-48 hours prior to rapid ascent and throughout the climb) Vs use as treatment, when symptoms of AMS arise. Continue until below the altitude where symptoms became bothersome.  Side effects--tingling hands, increased urination,hearing loss, changein taste, LH, upset stomach, vomiting and diarrhea  (similar to sx of AMS--so consider trying it before trip, take 1-2 days at home)  Prevention dose is '125mg'$  BID, starting 24 hours before ascent. Stop 2-3  days after peak arrival, or upon descent. Treatment dose for altitude sickness is '250mg'$  BID [facility] says caution with DM--may see some changes in blood glucose control (either lower or higher)     Mild AMS--HA, nausea, dizziness, decreased appetite, fatigue, SOB, disturbed sleep, malaise.  Mild--doesn't interfere with normal activity, and improve as body acclimatizes. Ascent can continue if mild. Moderate--severe HA, not r/b medication, n/v, weakness and fatigue, SOB, ataxia. Use meds, descen 1000 feet x 24 hrs. Do not continue ascent. Severe AMS--SOB at rest, ataxia, unable to walk, altered mental status, pulmonary edema (cerebral edema HAVE, pulmonary edema HAPE) Descend 2000 feet    Follow Up Instructions:    I discussed the assessment and treatment plan with the patient. The patient was provided an opportunity to ask questions and all were answered. The patient agreed with the plan and demonstrated an understanding of the instructions.   The patient was advised to call back or seek an in-person evaluation if the symptoms worsen or if the condition fails to improve as anticipated.  I spent *** minutes dedicated to the care of this patient, including pre-visit review of records, face to face time, post-visit ordering of testing and documentation.    Vikki Ports, MD

## 2021-09-16 ENCOUNTER — Encounter: Payer: Self-pay | Admitting: Family Medicine

## 2021-09-16 ENCOUNTER — Telehealth (INDEPENDENT_AMBULATORY_CARE_PROVIDER_SITE_OTHER): Payer: BC Managed Care – PPO | Admitting: Family Medicine

## 2021-09-16 VITALS — Ht 65.0 in | Wt 122.0 lb

## 2021-09-16 DIAGNOSIS — F419 Anxiety disorder, unspecified: Secondary | ICD-10-CM | POA: Diagnosis not present

## 2021-09-16 DIAGNOSIS — Z7184 Encounter for health counseling related to travel: Secondary | ICD-10-CM | POA: Diagnosis not present

## 2021-09-16 MED ORDER — ALPRAZOLAM 0.5 MG PO TABS: 0.2500 mg | ORAL_TABLET | Freq: Three times a day (TID) | ORAL | 0 refills | Status: DC | PRN

## 2021-09-16 MED ORDER — ACETAZOLAMIDE 125 MG PO TABS: ORAL_TABLET | ORAL | 0 refills | Status: DC

## 2021-09-16 NOTE — Patient Instructions (Signed)
Prevention is slow ascension. Take rest days, climb to a higher altitude during the day, sleep at lower altitude at night. Stay well hydrated (4-5 liters of fluid daily), and eat high calorie diet even if appetite is low. Avoid alcohol, sleeping pills. I know these measures are likely well-planned by the tour/guide.   Diamox--can be used for prevention and treatment. Side effects--tingling hands, increased urination, hearing loss, change in taste, lightheaded, upset stomach, vomiting and diarrhea  (similar to symptoms of altitude sickness)  I recommend that if taking for PREVENTION (rather than treatment), that you consider trying it before trip, taking it 1-2 days while at home, to ensure that you tolerate it before taking it on the trip and not being able to tell the different between altitude sickness and side effects of med (and also help you decide if you want to take it preventatively).  I do think that your frequent hiking at elevation will make you handle this better than others, but still best to be prepared.  Prevention dose is '125mg'$  twice daily, starting 24 hours before ascent. Stop 2-3 days after peak arrival, or upon descent. Treatment dose for altitude sickness is '250mg'$  twice daily. This can affect your blood sugars (either high or low), so be sure to continue to monitor carefully.   Mild AMS (acute mountain sickness--aka altitude sickness)--HA, nausea, dizziness, decreased appetite, fatigue, shortness of breath, disturbed sleep, malaise. This doesn't interfere with normal activity, and improves as body acclimatizes. Ascent can continue if mild. Moderate--severe headache, not relieved by medication, nausea and vomiting, weakness and fatigue, shortness of breath, ataxia. Use meds, descend 1000 feet x 24 hrs. Do not continue ascent. Severe AMS--Shortness of breath at rest, ataxia, unable to walk, altered mental status, pulmonary edema (cerebral edema HACE, pulmonary edema HAPE) Descend  2000 feet

## 2021-10-28 ENCOUNTER — Telehealth: Payer: Self-pay | Admitting: Licensed Clinical Social Worker

## 2021-10-28 DIAGNOSIS — K52839 Microscopic colitis, unspecified: Secondary | ICD-10-CM | POA: Diagnosis not present

## 2021-10-28 DIAGNOSIS — E109 Type 1 diabetes mellitus without complications: Secondary | ICD-10-CM | POA: Diagnosis not present

## 2021-10-28 DIAGNOSIS — E559 Vitamin D deficiency, unspecified: Secondary | ICD-10-CM | POA: Diagnosis not present

## 2021-10-28 DIAGNOSIS — N951 Menopausal and female climacteric states: Secondary | ICD-10-CM | POA: Diagnosis not present

## 2021-10-28 DIAGNOSIS — E039 Hypothyroidism, unspecified: Secondary | ICD-10-CM | POA: Diagnosis not present

## 2021-10-28 NOTE — Patient Outreach (Signed)
  Care Coordination   10/28/2021 Name: Briana Lopez MRN: [id number] DOB: September 03, 1965   Care Coordination Outreach Attempts:  An unsuccessful telephone outreach was attempted today to offer the patient information about available care coordination services as a benefit of their health plan.   Follow Up Plan:  Additional outreach attempts will be made to offer the patient care coordination information and services.   Encounter Outcome:  No Answer  Care Coordination Interventions Activated:  No   Care Coordination Interventions:  No, not indicated    Christa See, MSW, [email or web address] Phone [phone] 4:57 PM

## 2021-10-30 ENCOUNTER — Other Ambulatory Visit: Payer: Self-pay | Admitting: Family Medicine

## 2021-10-30 DIAGNOSIS — E7800 Pure hypercholesterolemia, unspecified: Secondary | ICD-10-CM

## 2021-11-01 NOTE — Telephone Encounter (Signed)
Has an appt in January

## 2021-11-19 DIAGNOSIS — Z1231 Encounter for screening mammogram for malignant neoplasm of breast: Secondary | ICD-10-CM | POA: Diagnosis not present

## 2021-11-19 LAB — HM MAMMOGRAPHY

## 2021-12-17 DIAGNOSIS — K52839 Microscopic colitis, unspecified: Secondary | ICD-10-CM | POA: Diagnosis not present

## 2021-12-18 DIAGNOSIS — J029 Acute pharyngitis, unspecified: Secondary | ICD-10-CM | POA: Diagnosis not present

## 2021-12-18 DIAGNOSIS — Z6820 Body mass index (BMI) 20.0-20.9, adult: Secondary | ICD-10-CM | POA: Diagnosis not present

## 2021-12-20 DIAGNOSIS — R7989 Other specified abnormal findings of blood chemistry: Secondary | ICD-10-CM | POA: Diagnosis not present

## 2021-12-20 DIAGNOSIS — E10649 Type 1 diabetes mellitus with hypoglycemia without coma: Secondary | ICD-10-CM | POA: Diagnosis not present

## 2021-12-20 DIAGNOSIS — E039 Hypothyroidism, unspecified: Secondary | ICD-10-CM | POA: Diagnosis not present

## 2021-12-20 DIAGNOSIS — K8681 Exocrine pancreatic insufficiency: Secondary | ICD-10-CM | POA: Diagnosis not present

## 2021-12-20 DIAGNOSIS — K52839 Microscopic colitis, unspecified: Secondary | ICD-10-CM | POA: Diagnosis not present

## 2021-12-20 LAB — HEMOGLOBIN A1C: Hemoglobin A1C: 7

## 2021-12-20 LAB — TSH: TSH: 1.42 (ref 0.41–5.90)

## 2021-12-22 ENCOUNTER — Encounter: Payer: Self-pay | Admitting: Family Medicine

## 2021-12-22 ENCOUNTER — Encounter: Payer: Self-pay | Admitting: *Deleted

## 2022-02-16 DIAGNOSIS — E103293 Type 1 diabetes mellitus with mild nonproliferative diabetic retinopathy without macular edema, bilateral: Secondary | ICD-10-CM | POA: Diagnosis not present

## 2022-02-16 DIAGNOSIS — H5213 Myopia, bilateral: Secondary | ICD-10-CM | POA: Diagnosis not present

## 2022-02-20 NOTE — Progress Notes (Unsigned)
No chief complaint on file.  Briana Lopez is a 57 y.o. female who presents for a complete physical.    She has some anxiety periodically (with flying, prior to MRI).  1/2 xanax is effective.    Microscopic colitis, exocrine pancreatic insufficiency--under the care of GI at [facility]. Last saw Dr. Loni Muse in November. Her GI symptoms resolved, being treated with budesonide.  At November visit, she was on '6mg'$  of budeonide daily, with plan to taper to '3mg'$  x 4 weeks and then stop, but to restart if diarrhea returned.  Has f/u scheduled 1/19.  UPDATE  She continues to see her endocrinologist regularly for her diabetes care and hypothyroidism.  She last saw Dr. Roberto Scales in November, A1c was 7.0%. Rare hypoglycemia, mild. Care Everywhere results/notes were reviewed.   She checks feet regularly without concerns. She had diabetic eye exam 01/2021 with Dr. Ellie Lunch, mild diabetic retinopathy noted.  She denies any vision changes.   Hypothyroidism: She denies any thyroid-related complaints. Managed by endo, last seen in November. Lab Results  Component Value Date   TSH 1.42 12/20/2021     Hyperlipidemia:  She reports compliance with Crestor 10 mg daily, and denies side effects.  Lipids were at goal on last check.  Lab Results  Component Value Date   CHOL 168 05/19/2021   HDL 67 05/19/2021   LDLCALC 90 05/19/2021   TRIG 53 05/19/2021   CHOLHDL 2.5 05/19/2021   Menopausal symptoms:   Started on HRT by a doctor in [place].  Symptoms resolved.  She had abnormal vaginal bleeding (with elevated FSH) and was referred back to Dr. Helane Rima (had [place], Prometrium dose was increased, and she hasn't had any bleeding since then).  She reports she continues to see Dr. Helane Rima yearly, now gets her hormone rx from Dr. Helane Rima.  Immunization History  Administered Date(s) Administered   Hepatitis A, Adult 05/16/2014, 02/04/2019   Influenza Split 01/06/2016, 12/16/2017   Influenza, Quadrivalent, Recombinant, Inj, Pf  12/16/2017   Influenza, Seasonal, Injecte, Preservative Fre 11/08/2010   Influenza,inj,Quad PF,6+ Mos 09/25/2014, 01/06/2016, 03/03/2017, 11/11/2019   Influenza-Unspecified 11/07/2018, 10/30/2020   Moderna Covid-19 Vaccine Bivalent Booster 83yr & up 10/30/2020   Moderna Sars-Covid-2 Vaccination 04/19/2019, 05/16/2019, 01/10/2020, 06/10/2020   Pneumococcal Polysaccharide-23 03/03/2003, 01/25/2018   Tdap 04/05/2010, 05/16/2014   Typhoid Inactivated 05/16/2014   Zoster Recombinat (Shingrix) 02/11/2021, 05/19/2021   Last Pap smear: 01/2019, normal, no high risk HPV; UPDATE Last mammogram: 11/2021 Last colonoscopy:  06/2021, normal; biopsy showed lymphocytic colitis Last DEXA: T-1.5 L fem neck 10/2019 (Dr. GHelane Rima Dentist: 2 times/yr Ophtho: yearly Exercise: 2 days High intensity cardio (HIIT or Peloton workout); 3x/week cardio with strength training; 2 days of yoga or walking. [place] for an hour 3x/week with friends. Walks dog daily for an hour. Golfing with her husband (walks the course at the beach).   Normal vitamin D level (46.1) in 01/2019; 35.6 in 01/2021.  PMH, PSH, SH reviewed  Currently dry January.  Typically drinks Thursday through Saturday, and cut back on intake (1 tequila, and 1-2 glasses of wine.  [place] more [place] [employer](social[place] friends[place]     ROS: The patient denies anorexia, fever, weight changes, headaches,  vision changes, decreased hearing, ear pain, sore throat, breast concerns, chest pain, palpitations, dizziness, syncope, dyspnea on exertion, cough, swelling, nausea, vomiting, abdominal pain, melena, hematochezia, indigestion/heartburn, hematuria, incontinence, dysuria, genital lesions, vaginal dryness,joint pains; no numbness, tingling, weakness, tremor, suspicious skin lesions, depression, anxiety, abnormal bleeding/bruising, or enlarged  lymph nodes. No vaginal bleeding. No night sweats or hot flashes. Rare pain in R foot,  r/b accupuncture, no other neuropathy symptoms. Moods are overall good. Some anxiety, sees therapist regularly. Denies depression.  Bowels No weight changes.   PHYSICAL EXAM:  LMP 11/06/2019   Wt Readings from Last 3 Encounters:  09/16/21 122 lb (55.3 kg)  02/11/21 123 lb (55.8 kg)  02/05/20 121 lb 12.8 oz (55.2 kg)    General Appearance:     Alert, cooperative, no distress, appears stated age. Pt declined to change into gown for visit today   Head:     Normocephalic, without obvious abnormality, atraumatic.  Eyes:     PERRL, conjunctiva/corneas clear, EOM's intact, fundi benign.   Ears:     Normal TM's and external ear canals.   Nose:    Not examined, wearing mask due to COVID-19 pandemic  Throat:    Not examined, wearing mask due to COVID-19 pandemic  Neck:    Supple, no lymphadenopathy;  thyroid:  no enlargement/ tenderness/nodules; no carotid bruit or JVD   Back:     Spine nontender, no curvature, ROM normal, no CVA     tenderness   Lungs:      Clear to auscultation bilaterally without wheezes, rales or     ronchi; respirations unlabored   Chest Wall:     No tenderness or deformity    Heart:     Regular rate and rhythm, S1 and S2 normal, no murmur, rub   or gallop   Breast Exam:     Deferred to GYN.   Abdomen:      Soft, non-tender, nondistended, normoactive bowel sounds,     no masses, no hepatosplenomegaly   Genitalia:     Deferred to GYN  Rectal:     Deferred   Extremities:    No clubbing, cyanosis or edema. Normal monofilament exam  Pulses:    2+ and symmetric all extremities   Skin:    Skin color, texture, turgor normal, no rashes or lesions though exam is very limited today (not undressed; sees GYN)  Lymph nodes:    Cervical, supraclavicular nodes normal   Neurologic:    Normal strength, sensation and gait; reflexes 2+ and symmetric throughout                            Psych:   Normal mood, affect, hygiene and grooming.    Normal diabetic foot exam.  ***update if  not wearing mask DIABETIC FOOT EXAM  ASSESSMENT/PLAN:  Enter/offer flu and covid vaccines Has she seen ophtho since 01/2021?  If so, please get notes We never got notes/paps from Dr. Helane Rima.  ? Get her to sign ROR in case that was the issue??  When was she last seen? Need notes and last pap.  Cbc, c-met, Urine microalbumin, lipids  Prev discussed ACEI/ARB--[facility] doesn't have her on; BP has been good, microalbumin has been negative. Due for recheck today.   Discussed monthly self breast exams and yearly mammograms; at least 30 minutes of aerobic activity at least 5 days/week, weight bearing exercise at least 2x/wk; proper sunscreen use reviewed; healthy diet, including goals of calcium and vitamin D intake and alcohol recommendations (less than or equal to 1 drink/day--she was encouraged to cut back) reviewed; regular seatbelt use; changing batteries in smoke detectors, carbon monoxide detectors.  Immunization recommendations discussed--continue yearly flu shots.  Colonoscopy recommendations reviewed--UTD.  F/u 1 year, sooner  prn.

## 2022-02-20 NOTE — Patient Instructions (Incomplete)
  HEALTH MAINTENANCE RECOMMENDATIONS:  It is recommended that you get at least 30 minutes of aerobic exercise at least 5 days/week (for weight loss, you may need as much as 60-90 minutes). This can be any activity that gets your heart rate up. This can be divided in 10-15 minute intervals if needed, but try and build up your endurance at least once a week.  Weight bearing exercise is also recommended twice weekly.  Eat a healthy diet with lots of vegetables, fruits and fiber.  "Colorful" foods have a lot of vitamins (ie green vegetables, tomatoes, red peppers, etc).  Limit sweet tea, regular sodas and alcoholic beverages, all of which has a lot of calories and sugar.  Up to 1 alcoholic drink daily may be beneficial for women (unless trying to lose weight, watch sugars).  Drink a lot of water.  Calcium recommendations are 1200-1500 mg daily (1500 mg for postmenopausal women or women without ovaries), and vitamin D 1000 IU daily.  This should be obtained from diet and/or supplements (vitamins), and calcium should not be taken all at once, but in divided doses.  Monthly self breast exams and yearly mammograms for women over the age of 60 is recommended.  Sunscreen of at least SPF 30 should be used on all sun-exposed parts of the skin when outside between the hours of 10 am and 4 pm (not just when at beach or pool, but even with exercise, golf, tennis, and yard work!)  Use a sunscreen that says "broad spectrum" so it covers both UVA and UVB rays, and make sure to reapply every 1-2 hours.  Remember to change the batteries in your smoke detectors when changing your clock times in the spring and fall. Carbon monoxide detectors are recommended for your home.  Use your seat belt every time you are in a car, and please drive safely and not be distracted with cell phones and texting while driving.  Use saline nasal spray regularly throughout the day during the winter to help prevent nosebleeds.  Start using  Flonase regularly--2 gentle sniffs into each nostril, daily.  This should help with your chronic nasal congestion and help with snoring. If you continue to snore, and if your husband hears pauses in your breathing, let us know and we can order a home sleep study.

## 2022-02-21 ENCOUNTER — Ambulatory Visit (INDEPENDENT_AMBULATORY_CARE_PROVIDER_SITE_OTHER): Payer: BC Managed Care – PPO | Admitting: Family Medicine

## 2022-02-21 ENCOUNTER — Encounter: Payer: Self-pay | Admitting: Family Medicine

## 2022-02-21 VITALS — BP 114/70 | HR 60 | Ht 65.0 in | Wt 124.8 lb

## 2022-02-21 DIAGNOSIS — F419 Anxiety disorder, unspecified: Secondary | ICD-10-CM | POA: Diagnosis not present

## 2022-02-21 DIAGNOSIS — R0683 Snoring: Secondary | ICD-10-CM

## 2022-02-21 DIAGNOSIS — Z0000 Encounter for general adult medical examination without abnormal findings: Secondary | ICD-10-CM | POA: Diagnosis not present

## 2022-02-21 DIAGNOSIS — E039 Hypothyroidism, unspecified: Secondary | ICD-10-CM

## 2022-02-21 DIAGNOSIS — J309 Allergic rhinitis, unspecified: Secondary | ICD-10-CM

## 2022-02-21 DIAGNOSIS — E10649 Type 1 diabetes mellitus with hypoglycemia without coma: Secondary | ICD-10-CM

## 2022-02-21 DIAGNOSIS — E7800 Pure hypercholesterolemia, unspecified: Secondary | ICD-10-CM

## 2022-02-21 LAB — POCT URINALYSIS DIP (PROADVANTAGE DEVICE)
Bilirubin, UA: NEGATIVE
Blood, UA: NEGATIVE
Glucose, UA: >=1000 mg/dL — AB
Ketones, POC UA: NEGATIVE mg/dL
Leukocytes, UA: NEGATIVE
Nitrite, UA: NEGATIVE
Protein Ur, POC: NEGATIVE mg/dL
Specific Gravity, Urine: 1.015
Urobilinogen, Ur: 0.2
pH, UA: 6 (ref 5.0–8.0)

## 2022-02-21 LAB — LIPID PANEL

## 2022-02-21 MED ORDER — ALPRAZOLAM 0.5 MG PO TABS: 0.2500 mg | ORAL_TABLET | Freq: Three times a day (TID) | ORAL | 0 refills | Status: DC | PRN

## 2022-02-22 LAB — CBC WITH DIFFERENTIAL/PLATELET
Basophils Absolute: 0.1 10*3/uL (ref 0.0–0.2)
Basos: 1 %
EOS (ABSOLUTE): 0.1 10*3/uL (ref 0.0–0.4)
Eos: 2 %
Hematocrit: 45 % (ref 34.0–46.6)
Hemoglobin: 14.6 g/dL (ref 11.1–15.9)
Immature Grans (Abs): 0 10*3/uL (ref 0.0–0.1)
Immature Granulocytes: 0 %
Lymphocytes Absolute: 1 10*3/uL (ref 0.7–3.1)
Lymphs: 24 %
MCH: 29.4 pg (ref 26.6–33.0)
MCHC: 32.4 g/dL (ref 31.5–35.7)
MCV: 91 fL (ref 79–97)
Monocytes Absolute: 0.4 10*3/uL (ref 0.1–0.9)
Monocytes: 10 %
Neutrophils Absolute: 2.6 10*3/uL (ref 1.4–7.0)
Neutrophils: 63 %
Platelets: 318 10*3/uL (ref 150–450)
RBC: 4.96 x10E6/uL (ref 3.77–5.28)
RDW: 13.2 % (ref 11.7–15.4)
WBC: 4.1 10*3/uL (ref 3.4–10.8)

## 2022-02-22 LAB — COMPREHENSIVE METABOLIC PANEL
ALT: 17 IU/L (ref 0–32)
AST: 20 IU/L (ref 0–40)
Albumin/Globulin Ratio: 1.9 (ref 1.2–2.2)
Albumin: 4.3 g/dL (ref 3.8–4.9)
Alkaline Phosphatase: 60 IU/L (ref 44–121)
BUN/Creatinine Ratio: 22 (ref 9–23)
BUN: 19 mg/dL (ref 6–24)
Bilirubin Total: 0.5 mg/dL (ref 0.0–1.2)
CO2: 25 mmol/L (ref 20–29)
Calcium: 9.5 mg/dL (ref 8.7–10.2)
Chloride: 103 mmol/L (ref 96–106)
Creatinine, Ser: 0.86 mg/dL (ref 0.57–1.00)
Globulin, Total: 2.3 g/dL (ref 1.5–4.5)
Glucose: 181 mg/dL — ABNORMAL HIGH (ref 70–99)
Potassium: 4.6 mmol/L (ref 3.5–5.2)
Sodium: 139 mmol/L (ref 134–144)
Total Protein: 6.6 g/dL (ref 6.0–8.5)
eGFR: 80 mL/min/{1.73_m2} (ref >59)

## 2022-02-22 LAB — LIPID PANEL
Chol/HDL Ratio: 2 ratio (ref 0.0–4.4)
Cholesterol, Total: 158 mg/dL (ref 100–199)
HDL: 78 mg/dL (ref >39)
LDL Chol Calc (NIH): 69 mg/dL (ref 0–99)
Triglycerides: 54 mg/dL (ref 0–149)
VLDL Cholesterol Cal: 11 mg/dL (ref 5–40)

## 2022-02-22 LAB — MICROALBUMIN / CREATININE URINE RATIO
Creatinine, Urine: 70.8 mg/dL
Microalb/Creat Ratio: 9 mg/g creat (ref 0–29)
Microalbumin, Urine: 6.3 ug/mL

## 2022-02-22 MED ORDER — ROSUVASTATIN CALCIUM 10 MG PO TABS: 10.0000 mg | ORAL_TABLET | Freq: Every day | ORAL | 3 refills | Status: DC

## 2022-02-24 ENCOUNTER — Encounter: Payer: Self-pay | Admitting: *Deleted

## 2022-02-25 DIAGNOSIS — E039 Hypothyroidism, unspecified: Secondary | ICD-10-CM | POA: Diagnosis not present

## 2022-02-25 DIAGNOSIS — K52839 Microscopic colitis, unspecified: Secondary | ICD-10-CM | POA: Diagnosis not present

## 2022-02-25 DIAGNOSIS — E109 Type 1 diabetes mellitus without complications: Secondary | ICD-10-CM | POA: Diagnosis not present

## 2022-02-25 DIAGNOSIS — Z79899 Other long term (current) drug therapy: Secondary | ICD-10-CM | POA: Diagnosis not present

## 2022-03-02 DIAGNOSIS — Z124 Encounter for screening for malignant neoplasm of cervix: Secondary | ICD-10-CM | POA: Diagnosis not present

## 2022-03-02 DIAGNOSIS — Z6820 Body mass index (BMI) 20.0-20.9, adult: Secondary | ICD-10-CM | POA: Diagnosis not present

## 2022-03-02 DIAGNOSIS — Z01419 Encounter for gynecological examination (general) (routine) without abnormal findings: Secondary | ICD-10-CM | POA: Diagnosis not present

## 2022-03-02 DIAGNOSIS — Z13820 Encounter for screening for osteoporosis: Secondary | ICD-10-CM | POA: Diagnosis not present

## 2022-03-02 LAB — HM DEXA SCAN

## 2022-03-03 LAB — HM PAP SMEAR: HM Pap smear: NEGATIVE

## 2022-03-10 ENCOUNTER — Encounter: Payer: Self-pay | Admitting: *Deleted

## 2022-06-03 DIAGNOSIS — K52839 Microscopic colitis, unspecified: Secondary | ICD-10-CM | POA: Diagnosis not present

## 2022-07-07 ENCOUNTER — Telehealth: Payer: Self-pay | Admitting: Family Medicine

## 2022-07-07 NOTE — Telephone Encounter (Signed)
Called patient and left message asking her to please call back for clarification.

## 2022-07-07 NOTE — Telephone Encounter (Signed)
Something for nausea states she is leaving on a cruise tomorrow, and was sick to her stomach tomorrrow,  Pt uses  [facility] - [place], [place] - [address]

## 2022-07-07 NOTE — Telephone Encounter (Signed)
Called patient again.

## 2022-07-07 NOTE — Telephone Encounter (Signed)
Pt called and is requesting

## 2022-07-07 NOTE — Telephone Encounter (Signed)
I see in her chart that she has previously had phenergan suppositories and tablets.  Is this what she is asking for? Okay to refill the tablets or suppositories, whichever she prefers.  Doesn't look like she is asking for patches for a cruise, just for a med for nausea that she is currently having??

## 2022-07-08 ENCOUNTER — Telehealth: Payer: Self-pay | Admitting: Family Medicine

## 2022-07-08 ENCOUNTER — Other Ambulatory Visit: Payer: Self-pay

## 2022-07-08 MED ORDER — SCOPOLAMINE 1 MG/3DAYS TD PT72
1.0000 | MEDICATED_PATCH | TRANSDERMAL | 0 refills | Status: DC
Start: 2022-07-08 — End: 2023-02-27

## 2022-07-08 NOTE — Telephone Encounter (Signed)
Pt called back to speak to [place] about the previous message. She advised that she was talking about "stuff you needed for motion sickness for a cruise". I stated are you talking about patches she replied I guess so. I advised that Dr. Lynelle Doctor was not in and she states that she is leaving for her cruise in just a little while. I contacted Dr. Lynelle Doctor via text and she authorized medication and Adam CMA ordered. I tried to contact pt multiple times to advise and then left her a message.

## 2022-10-12 DIAGNOSIS — E039 Hypothyroidism, unspecified: Secondary | ICD-10-CM | POA: Diagnosis not present

## 2022-10-12 DIAGNOSIS — R7989 Other specified abnormal findings of blood chemistry: Secondary | ICD-10-CM | POA: Diagnosis not present

## 2022-10-12 DIAGNOSIS — E10649 Type 1 diabetes mellitus with hypoglycemia without coma: Secondary | ICD-10-CM | POA: Diagnosis not present

## 2022-10-12 LAB — HEMOGLOBIN A1C: Hemoglobin A1C: 6.2

## 2022-10-25 DIAGNOSIS — M25512 Pain in left shoulder: Secondary | ICD-10-CM | POA: Diagnosis not present

## 2022-11-02 ENCOUNTER — Encounter: Payer: Self-pay | Admitting: *Deleted

## 2022-11-03 DIAGNOSIS — S46012D Strain of muscle(s) and tendon(s) of the rotator cuff of left shoulder, subsequent encounter: Secondary | ICD-10-CM | POA: Diagnosis not present

## 2022-11-07 DIAGNOSIS — S46012D Strain of muscle(s) and tendon(s) of the rotator cuff of left shoulder, subsequent encounter: Secondary | ICD-10-CM | POA: Diagnosis not present

## 2022-11-11 DIAGNOSIS — S46012D Strain of muscle(s) and tendon(s) of the rotator cuff of left shoulder, subsequent encounter: Secondary | ICD-10-CM | POA: Diagnosis not present

## 2022-11-15 DIAGNOSIS — S46012D Strain of muscle(s) and tendon(s) of the rotator cuff of left shoulder, subsequent encounter: Secondary | ICD-10-CM | POA: Diagnosis not present

## 2022-11-21 DIAGNOSIS — Z1231 Encounter for screening mammogram for malignant neoplasm of breast: Secondary | ICD-10-CM | POA: Diagnosis not present

## 2022-11-21 LAB — HM MAMMOGRAPHY

## 2022-11-22 DIAGNOSIS — S46012D Strain of muscle(s) and tendon(s) of the rotator cuff of left shoulder, subsequent encounter: Secondary | ICD-10-CM | POA: Diagnosis not present

## 2022-11-23 ENCOUNTER — Encounter: Payer: Self-pay | Admitting: *Deleted

## 2022-11-25 DIAGNOSIS — S46012D Strain of muscle(s) and tendon(s) of the rotator cuff of left shoulder, subsequent encounter: Secondary | ICD-10-CM | POA: Diagnosis not present

## 2022-11-28 DIAGNOSIS — S46012D Strain of muscle(s) and tendon(s) of the rotator cuff of left shoulder, subsequent encounter: Secondary | ICD-10-CM | POA: Diagnosis not present

## 2022-12-05 DIAGNOSIS — S46012D Strain of muscle(s) and tendon(s) of the rotator cuff of left shoulder, subsequent encounter: Secondary | ICD-10-CM | POA: Diagnosis not present

## 2022-12-08 DIAGNOSIS — S46012D Strain of muscle(s) and tendon(s) of the rotator cuff of left shoulder, subsequent encounter: Secondary | ICD-10-CM | POA: Diagnosis not present

## 2022-12-20 DIAGNOSIS — S46012D Strain of muscle(s) and tendon(s) of the rotator cuff of left shoulder, subsequent encounter: Secondary | ICD-10-CM | POA: Diagnosis not present

## 2022-12-27 DIAGNOSIS — S46012D Strain of muscle(s) and tendon(s) of the rotator cuff of left shoulder, subsequent encounter: Secondary | ICD-10-CM | POA: Diagnosis not present

## 2023-01-03 DIAGNOSIS — S46012D Strain of muscle(s) and tendon(s) of the rotator cuff of left shoulder, subsequent encounter: Secondary | ICD-10-CM | POA: Diagnosis not present

## 2023-01-13 DIAGNOSIS — S46012D Strain of muscle(s) and tendon(s) of the rotator cuff of left shoulder, subsequent encounter: Secondary | ICD-10-CM | POA: Diagnosis not present

## 2023-01-20 DIAGNOSIS — S46012D Strain of muscle(s) and tendon(s) of the rotator cuff of left shoulder, subsequent encounter: Secondary | ICD-10-CM | POA: Diagnosis not present

## 2023-01-26 DIAGNOSIS — S46012D Strain of muscle(s) and tendon(s) of the rotator cuff of left shoulder, subsequent encounter: Secondary | ICD-10-CM | POA: Diagnosis not present

## 2023-02-16 DIAGNOSIS — S46012D Strain of muscle(s) and tendon(s) of the rotator cuff of left shoulder, subsequent encounter: Secondary | ICD-10-CM | POA: Diagnosis not present

## 2023-02-23 ENCOUNTER — Encounter: Payer: Self-pay | Admitting: *Deleted

## 2023-02-26 DIAGNOSIS — I700 Atherosclerosis of aorta: Secondary | ICD-10-CM | POA: Insufficient documentation

## 2023-02-26 NOTE — Patient Instructions (Incomplete)
  HEALTH MAINTENANCE RECOMMENDATIONS:  It is recommended that you get at least 30 minutes of aerobic exercise at least 5 days/week (for weight loss, you may need as much as 60-90 minutes). This can be any activity that gets your heart rate up. This can be divided in 10-15 minute intervals if needed, but try and build up your endurance at least once a week.  Weight bearing exercise is also recommended twice weekly.  Eat a healthy diet with lots of vegetables, fruits and fiber.  "Colorful" foods have a lot of vitamins (ie green vegetables, tomatoes, red peppers, etc).  Limit sweet tea, regular sodas and alcoholic beverages, all of which has a lot of calories and sugar.  Up to 1 alcoholic drink daily may be beneficial for women (unless trying to lose weight, watch sugars).  Drink a lot of water.  Calcium recommendations are 1200-1500 mg daily (1500 mg for postmenopausal women or women without ovaries), and vitamin D 1000 IU daily.  This should be obtained from diet and/or supplements (vitamins), and calcium should not be taken all at once, but in divided doses.  Monthly self breast exams and yearly mammograms for women over the age of 28 is recommended.  Sunscreen of at least SPF 30 should be used on all sun-exposed parts of the skin when outside between the hours of 10 am and 4 pm (not just when at beach or pool, but even with exercise, golf, tennis, and yard work!)  Use a sunscreen that says "broad spectrum" so it covers both UVA and UVB rays, and make sure to reapply every 1-2 hours.  Remember to change the batteries in your smoke detectors when changing your clock times in the spring and fall. Carbon monoxide detectors are recommended for your home.  Use your seat belt every time you are in a car, and please drive safely and not be distracted with cell phones and texting while driving.  Please schedule your GYN exam with Dr. Roselyn Reef should be doing a well woman exam (with breast and  pelvic).  Remember to aim the Flonase slightly away from midline, to minimize nosebleeds. Use nasal saline frequently. These things should help lessen nosebleeds. Check with your husband to ensure that you don't have frequent long pauses in your breathing, when snoring.  If so, then we may need to evaluate for sleep apnea.  You likely just have snoring (exacerbated by nasal congestion).

## 2023-02-26 NOTE — Progress Notes (Unsigned)
No chief complaint on file.  Briana Lopez is a 58 y.o. female who presents for a complete physical.  She had GYN visit with Dr. Vincente Poli in 02/2022.  She previously mentioned snoring, and unrefreshed sleep. She had a definitely component of congestion/allergies at that time.  UPDATE *** snoring? Apnea sx? Need for sleep study??  She has some anxiety periodically (with flying, prior to MRI).  1/2 xanax is effective.  Anxiety was worse when on higher doses of budesonide for colitis. Last filled alprazolam #20 on 02/21/22.  Microscopic colitis, exocrine pancreatic insufficiency--under the care of GI at [facility]. Last saw Dr. Carmon Sails in 05/2022. 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 She responded well to Budesonide. She had recurrence of diarrhea when she tried stopping budesonide. Per April 2024 visit, was doing well on 3 mg every other day (taking calcium for osteopenia helps combat diarrhea also).  Currently, no diarrhea or loose stool. No nocturnal BMs.    She continues to see her endocrinologist regularly for her diabetes care and hypothyroidism.  She last saw Dr. Almond Lint in September, A1c was 6.2%.  Rare hypoglycemia, mild. Care Everywhere results/notes were reviewed.   She checks feet regularly without concerns. She had diabetic eye exam was ?*** with Dr. Charlotte Sanes (no notes received yet), mild diabetic retinopathy unchanged, per pt. UPDATE ***  She denies any vision changes.  Hypothyroidism: She denies any thyroid-related complaints. Managed by endo. Last TSH was 1.419 in 12/2021, due for recheck now.   Hyperlipidemia:  She reports compliance with Crestor 10 mg daily, and denies side effects. Red meat 2x/week, +butter (but cooks with olive oil). Not much cheese or dairy Lipids were at goal on last check.  Lab Results  Component Value Date   CHOL 158 02/21/2022   HDL 78 02/21/2022   LDLCALC 69 02/21/2022   TRIG 54 02/21/2022   CHOLHDL 2.0 02/21/2022   Menopausal  symptoms:  She is on HRT--initially prescribed by a doctor in [place], now getting from Dr. Vincente Poli. She last saw Dr. Vincente Poli in 02/2022. She denies any vaginal bleeding. Night sweats? Hot flashes?   Immunization History  Administered Date(s) Administered   Hepatitis A, Adult 05/16/2014, 02/04/2019   Influenza Split 01/06/2016, 12/16/2017   Influenza, Quadrivalent, Recombinant, Inj, Pf 12/16/2017   Influenza, Seasonal, Injecte, Preservative Fre 11/08/2010   Influenza,inj,Quad PF,6+ Mos 09/25/2014, 01/06/2016, 03/03/2017, 11/11/2019   Influenza-Unspecified 12/16/2017, 11/07/2018, 10/30/2020, 12/22/2021, 11/01/2022   Moderna Covid-19 Vaccine Bivalent Booster 61yrs & up 10/30/2020   Moderna Sars-Covid-2 Vaccination 04/19/2019, 05/16/2019, 01/10/2020, 06/10/2020, 12/22/2021   Pneumococcal Polysaccharide-23 03/03/2003, 01/25/2018   Tdap 04/05/2010, 05/16/2014   Typhoid Inactivated 05/16/2014   Zoster Recombinant(Shingrix) 02/11/2021, 05/19/2021   Last Pap smear: 01/2019, normal, no high risk HPV. Last GYN exam 02/2022, stated pap was due, no results received. Last mammogram: 11/2022 Last colonoscopy:  06/2021, normal; biopsy showed lymphocytic colitis Last DEXA: 02/2022 T-1.7 at L fem neck (was T-1.5 L fem neck 10/2019)--with Dr. Vincente Poli Dentist: 2 times/yr Ophtho: yearly Exercise:  2 days High intensity cardio (HIIT or Peloton workout); 3x/week cardio with strength training; 2 days of yoga or walking.  Walks dog daily for an hour. Golfing with her husband (walks the course at the beach). Lots of hiking, and skis every winter.   Calcium intake--Almond milk in smoothies (not daily), lots of leafy green vegetables.  She sometimes has a protein shake (Core) that has 1000mg  of calcium. Calcium supplement.  ***UPDATE  Normal vitamin D level (46.1) in 01/2019; 35.6  in 01/2021.   PMH, PSH, SH reviewed  Currently dry January. Did it last 2 years also. Typically drinks Thursday through Saturday,  and cut back on intake (1 tequila, and 1-2 glasses of wine.  Drinks more champagne when in [employer] (socializing with visiting friends)).    ROS: The patient denies anorexia, fever, weight changes, headaches,  vision changes, decreased hearing, ear pain, sore throat, breast concerns, chest pain, palpitations, dizziness, syncope, dyspnea on exertion, cough, swelling, nausea, vomiting, abdominal pain, melena, hematochezia, indigestion/heartburn, hematuria, incontinence, dysuria, genital lesions, vaginal dryness,joint pains; no numbness, tingling, weakness, tremor, suspicious skin lesions, depression, anxiety, abnormal bleeding/bruising, or enlarged lymph nodes.  No vaginal bleeding. No night sweats or hot flashes. Denies neuropathy in feet. Moods are overall good. Some anxiety, has a therapist, only sees her prn. Denies depression. Bowels are normal. No weight changes. Snoring per HPI   PHYSICAL EXAM:  LMP 11/06/2019   Wt Readings from Last 3 Encounters:  02/21/22 124 lb 12.8 oz (56.6 kg)  09/16/21 122 lb (55.3 kg)  02/11/21 123 lb (55.8 kg)    General Appearance:     Alert, cooperative, no distress, appears stated age.    Head:     Normocephalic, without obvious abnormality, atraumatic.  Eyes:     PERRL, conjunctiva/corneas clear, EOM's intact, fundi benign.   Ears:     Normal TM's and external ear canals.   Nose:    mild-mod mucosal edema with clear mucus on the R>L. Sinuses nontender ***  Throat:    Normal mucosa  Neck:    Supple, no lymphadenopathy;  thyroid:  no enlargement/ tenderness/nodules; no carotid bruit or JVD   Back:     Spine nontender, no curvature, ROM normal, no CVA     tenderness   Lungs:      Clear to auscultation bilaterally without wheezes, rales or     ronchi; respirations unlabored   Chest Wall:     No tenderness or deformity    Heart:     Regular rate and rhythm, S1 and S2 normal, no murmur, rub   or gallop   Breast Exam:     Deferred to GYN.   Abdomen:       Soft, non-tender, nondistended, normoactive bowel sounds,     no masses, no hepatosplenomegaly   Genitalia:     Deferred to GYN  Rectal:     Deferred   Extremities:    No clubbing, cyanosis or edema. Normal monofilament exam  Pulses:    2+ and symmetric all extremities   Skin:    Skin color, texture, turgor normal, no rashes or lesions though exam is very limited today (not undressed; sees GYN)  Lymph nodes:    Cervical, supraclavicular and inguinal nodes normal   Neurologic:    Normal strength, sensation and gait; reflexes 2+ and symmetric throughout                            Psych:   Normal mood, affect, hygiene and grooming.    Normal diabetic foot exam.   ***UPDATE nose   ASSESSMENT/PLAN:  Did she get COVID booster? If not, can offer. Can also have EITHER pneumovax booster, or Prevnar-20 (vs wait--has been 5 years since last pneumovax)  Last diabetic eye exam we have is from 01/2021--has she gone since? Please get records  Scheduled to see Dr. Vincente Poli? (Last seen 02/2022). Notes from 02/2022 stated pap was due, didn't get results.  Was one done? If so, need results.  Dr. Almond Lint wanted TSH checked with labs today    Discussed monthly self breast exams and yearly mammograms; at least 30 minutes of aerobic activity at least 5 days/week, weight bearing exercise at least 2x/wk; proper sunscreen use reviewed; healthy diet, including goals of calcium and vitamin D intake and alcohol recommendations (less than or equal to 1 drink/day--she was encouraged to cut back) reviewed; regular seatbelt use; changing batteries in smoke detectors, carbon monoxide detectors.  Immunization recommendations  discussed, continue yearly flu shots. Colonoscopy recommendations reviewed--UTD.  F/u 1 year, sooner prn.

## 2023-02-27 ENCOUNTER — Ambulatory Visit (INDEPENDENT_AMBULATORY_CARE_PROVIDER_SITE_OTHER): Payer: BC Managed Care – PPO | Admitting: Family Medicine

## 2023-02-27 ENCOUNTER — Encounter: Payer: Self-pay | Admitting: Family Medicine

## 2023-02-27 VITALS — BP 108/68 | HR 68 | Ht 65.0 in | Wt 124.0 lb

## 2023-02-27 DIAGNOSIS — Z23 Encounter for immunization: Secondary | ICD-10-CM

## 2023-02-27 DIAGNOSIS — E039 Hypothyroidism, unspecified: Secondary | ICD-10-CM | POA: Diagnosis not present

## 2023-02-27 DIAGNOSIS — I700 Atherosclerosis of aorta: Secondary | ICD-10-CM | POA: Diagnosis not present

## 2023-02-27 DIAGNOSIS — E10649 Type 1 diabetes mellitus with hypoglycemia without coma: Secondary | ICD-10-CM

## 2023-02-27 DIAGNOSIS — E7800 Pure hypercholesterolemia, unspecified: Secondary | ICD-10-CM

## 2023-02-27 DIAGNOSIS — Z9189 Other specified personal risk factors, not elsewhere classified: Secondary | ICD-10-CM

## 2023-02-27 DIAGNOSIS — Z0000 Encounter for general adult medical examination without abnormal findings: Secondary | ICD-10-CM | POA: Diagnosis not present

## 2023-02-27 DIAGNOSIS — F419 Anxiety disorder, unspecified: Secondary | ICD-10-CM

## 2023-02-27 DIAGNOSIS — J309 Allergic rhinitis, unspecified: Secondary | ICD-10-CM

## 2023-02-27 DIAGNOSIS — R0683 Snoring: Secondary | ICD-10-CM

## 2023-02-27 DIAGNOSIS — Z87898 Personal history of other specified conditions: Secondary | ICD-10-CM

## 2023-02-27 MED ORDER — ROSUVASTATIN CALCIUM 10 MG PO TABS: 10.0000 mg | ORAL_TABLET | Freq: Every day | ORAL | 3 refills | Status: DC

## 2023-02-27 MED ORDER — ALPRAZOLAM 0.5 MG PO TABS: 0.2500 mg | ORAL_TABLET | Freq: Three times a day (TID) | ORAL | 0 refills | Status: AC | PRN

## 2023-02-27 MED ORDER — SCOPOLAMINE 1 MG/3DAYS TD PT72: 1.0000 | MEDICATED_PATCH | TRANSDERMAL | 0 refills | Status: AC

## 2023-02-28 ENCOUNTER — Encounter: Payer: Self-pay | Admitting: Family Medicine

## 2023-03-01 ENCOUNTER — Encounter: Payer: Self-pay | Admitting: Family Medicine

## 2023-03-01 DIAGNOSIS — E7800 Pure hypercholesterolemia, unspecified: Secondary | ICD-10-CM

## 2023-03-01 LAB — NMR LIPOPROF + GRAPH
Cholesterol, Total: 201 mg/dL — ABNORMAL HIGH (ref 100–199)
HDL Particle Number: 29.9 umol/L — ABNORMAL LOW (ref >=30.5)
HDL-C: 79 mg/dL (ref >39)
LDL Particle Number: 1274 nmol/L — ABNORMAL HIGH (ref <1000)
LDL Size: 20.9 nm (ref >20.5)
LDL-C (NIH Calc): 109 mg/dL — ABNORMAL HIGH (ref 0–99)
Triglycerides: 74 mg/dL (ref 0–149)

## 2023-03-01 LAB — CMP14+EGFR
ALT: 19 [IU]/L (ref 0–32)
AST: 23 [IU]/L (ref 0–40)
Albumin: 4.1 g/dL (ref 3.8–4.9)
Alkaline Phosphatase: 45 [IU]/L (ref 44–121)
BUN/Creatinine Ratio: 28 — ABNORMAL HIGH (ref 9–23)
BUN: 24 mg/dL (ref 6–24)
Bilirubin Total: 0.5 mg/dL (ref 0.0–1.2)
CO2: 26 mmol/L (ref 20–29)
Calcium: 9.2 mg/dL (ref 8.7–10.2)
Chloride: 100 mmol/L (ref 96–106)
Creatinine, Ser: 0.86 mg/dL (ref 0.57–1.00)
Globulin, Total: 2.1 g/dL (ref 1.5–4.5)
Glucose: 164 mg/dL — ABNORMAL HIGH (ref 70–99)
Potassium: 4.3 mmol/L (ref 3.5–5.2)
Sodium: 139 mmol/L (ref 134–144)
Total Protein: 6.2 g/dL (ref 6.0–8.5)
eGFR: 79 mL/min/{1.73_m2} (ref >59)

## 2023-03-01 LAB — URIC ACID: Uric Acid: 2.3 mg/dL — ABNORMAL LOW (ref 3.0–7.2)

## 2023-03-01 LAB — VITAMIN D 25 HYDROXY (VIT D DEFICIENCY, FRACTURES): Vit D, 25-Hydroxy: 64 ng/mL (ref 30.0–100.0)

## 2023-03-01 LAB — TSH: TSH: 2.72 u[IU]/mL (ref 0.450–4.500)

## 2023-03-01 LAB — HIGH SENSITIVITY CRP: CRP, High Sensitivity: 1.48 mg/L (ref 0.00–3.00)

## 2023-03-01 LAB — MICROALBUMIN / CREATININE URINE RATIO
Creatinine, Urine: 31.5 mg/dL
Microalb/Creat Ratio: <10 mg/g{creat} (ref 0–29)
Microalbumin, Urine: <3 ug/mL

## 2023-03-01 LAB — APOLIPOPROTEIN B: Apolipoprotein B: 89 mg/dL (ref <90)

## 2023-03-02 MED ORDER — ROSUVASTATIN CALCIUM 20 MG PO TABS: 20.0000 mg | ORAL_TABLET | Freq: Every day | ORAL | 3 refills | Status: AC

## 2023-03-02 NOTE — Telephone Encounter (Signed)
Please enter future orders for lipids and LFT's, and schedule lab visit for 2-3 months.

## 2023-03-06 ENCOUNTER — Other Ambulatory Visit: Payer: Self-pay | Admitting: *Deleted

## 2023-03-06 DIAGNOSIS — Z5181 Encounter for therapeutic drug level monitoring: Secondary | ICD-10-CM

## 2023-03-06 DIAGNOSIS — E7800 Pure hypercholesterolemia, unspecified: Secondary | ICD-10-CM

## 2023-03-06 DIAGNOSIS — Z01419 Encounter for gynecological examination (general) (routine) without abnormal findings: Secondary | ICD-10-CM | POA: Diagnosis not present

## 2023-03-06 DIAGNOSIS — Z6821 Body mass index (BMI) 21.0-21.9, adult: Secondary | ICD-10-CM | POA: Diagnosis not present

## 2023-03-07 DIAGNOSIS — E103293 Type 1 diabetes mellitus with mild nonproliferative diabetic retinopathy without macular edema, bilateral: Secondary | ICD-10-CM | POA: Diagnosis not present

## 2023-03-07 DIAGNOSIS — H5213 Myopia, bilateral: Secondary | ICD-10-CM | POA: Diagnosis not present

## 2023-03-07 LAB — OPHTHALMOLOGY REPORT-SCANNED

## 2023-03-07 LAB — HM DIABETES EYE EXAM

## 2023-03-09 ENCOUNTER — Telehealth (INDEPENDENT_AMBULATORY_CARE_PROVIDER_SITE_OTHER): Payer: Self-pay | Admitting: Family Medicine

## 2023-03-09 ENCOUNTER — Encounter: Payer: Self-pay | Admitting: Family Medicine

## 2023-03-09 VITALS — Ht 65.0 in | Wt 124.0 lb

## 2023-03-09 DIAGNOSIS — E10649 Type 1 diabetes mellitus with hypoglycemia without coma: Secondary | ICD-10-CM | POA: Diagnosis not present

## 2023-03-09 DIAGNOSIS — R197 Diarrhea, unspecified: Secondary | ICD-10-CM

## 2023-03-09 DIAGNOSIS — R1110 Vomiting, unspecified: Secondary | ICD-10-CM | POA: Diagnosis not present

## 2023-03-09 NOTE — Progress Notes (Signed)
Start time: 12:20 End time: 12:35  Virtual Visit via Video Note  I connected with Briana Lopez on 03/09/23 by a video enabled telemedicine application and verified that I am speaking with the correct person using two identifiers.  Location: Patient: at home Provider: office   I discussed the limitations of evaluation and management by telemedicine and the availability of in person appointments. The patient expressed understanding and agreed to proceed.  History of Present Illness:  Chief Complaint  Patient presents with   Emesis    VIRTUAL vomiting and diarrhea that started about 3:30 this am. Fever, HA and cold chills as well. Patient vomited while I was on the line for a good 8-10 minutes. She wants to go to the hospital but she would like you to call over first, last time she waited so long and they had her literally laying on the floor.    Patient presents with complaints of chills/sweats (doesn't have thermometer, just reports feeling hot/sweaty and chills), vomiting and diarrhea since 3:30 this morning. Took zofran around 11, vomited an hour later. Vomiting nonstop, now is just bile. She reports high ketones, but glucose 100 according to her pump  Diarrhea, watery/loose, had about 10 episodes, none recently. She can't keep down fluids, not even sips. She has phenergan suppositories, hasn't used them due to diarrhea. She reports some abdominal pain related to frequency of vomiting only, no abdominal pain otherwise. Denies bloody stools. She feels weak and dizzy. "I feel like I'm dying".  She feels like she needs to go to the hospital. Asking for me to call ahead, to lessen wait.  Recalls last incident at [facility] ER where she waited in the waiting room for a long time, was laying on floor. She refuses to go back there.  PMH, PSH, SH reviewed Type 1 diabetic, well controlled. Seen recently for CPE and doing well  Outpatient Encounter Medications as of 03/09/2023   Medication Sig Note   estradiol (CLIMARA - DOSED IN MG/24 HR) 0.025 mg/24hr patch Place 0.025 mg onto the skin once a week.    FIASP 100 UNIT/ML SOLN Load pump with 300 units every 3 days. Trial for postprandial hyperglycemia. 02/27/2023: Insulin pump   ondansetron (ZOFRAN-ODT) 4 MG disintegrating tablet Take 4 mg by mouth every 8 (eight) hours as needed for nausea or vomiting. 03/09/2023: Took today   ALPRAZolam (XANAX) 0.5 MG tablet Take 0.5-1 tablets (0.25-0.5 mg total) by mouth 3 (three) times daily as needed for anxiety (for flying). (Patient not taking: Reported on 03/09/2023) 03/09/2023: Did not take today   Ascorbic Acid (VITAMIN C) 1000 MG tablet Take 1,000 mg by mouth daily. (Patient not taking: Reported on 03/09/2023) 03/09/2023: Did not take today    b complex vitamins capsule Take 1 capsule by mouth daily. (Patient not taking: Reported on 03/09/2023) 03/09/2023: Did not take today    budesonide (ENTOCORT EC) 3 MG 24 hr capsule Take 3 mg by mouth every 3 (three) days. (Patient not taking: Reported on 03/09/2023) 03/09/2023: Did not take today    cholecalciferol (VITAMIN D3) 25 MCG (1000 UNIT) tablet Take 1,000 Units by mouth daily. (Patient not taking: Reported on 03/09/2023) 03/09/2023: Did not take today    Creatine POWD 1 Scoop by Does not apply route daily. (Patient not taking: Reported on 03/09/2023) 03/09/2023: Did not take today    dapagliflozin propanediol (FARXIGA) 5 MG TABS tablet Take by mouth. (Patient not taking: Reported on 03/09/2023) 03/09/2023: Did not take today    insulin  aspart (NOVOLOG) 100 UNIT/ML injection Inject into the skin 3 (three) times daily before meals. (Patient not taking: Reported on 02/27/2023) 12/18/2015: Insulin pump...continuous    insulin detemir (LEVEMIR) 100 UNIT/ML FlexPen 9 units twice a day. May increase as needed to keep fasting glucose between 70 and 130. Max dose 40 units daily (Patient not taking: Reported on 03/09/2023) 01/25/2018: For emergencies if  pump fails   insulin lispro (HUMALOG) 100 UNIT/ML KwikPen Inject into the skin. (Patient not taking: Reported on 03/09/2023) 01/25/2018: In case needed if pump stops working   Insulin Syringe-Needle U-100 31G X 15/64" 0.3 ML MISC 4-5 per day (Patient not taking: Reported on 03/09/2023)    Lancets MISC 1 each by Other route Six (6) times a day. (Patient not taking: Reported on 03/09/2023)    levothyroxine (SYNTHROID) 25 MCG tablet Take 1 tablet (25 mcg total) by mouth daily before breakfast. (Patient not taking: Reported on 03/09/2023) 03/09/2023: Did not take today    NON FORMULARY Take 2 capsules by mouth daily. (Patient not taking: Reported on 03/09/2023) 03/09/2023: Did not take today    progesterone (PROMETRIUM) 200 MG capsule Take 200 mg by mouth daily. (Patient not taking: Reported on 03/09/2023) 03/09/2023: Did not take today    promethazine (PHENERGAN) 25 MG suppository Place 1 suppository (25 mg total) rectally every 6 (six) hours as needed for nausea or vomiting. (Patient not taking: Reported on 02/21/2022) 03/09/2023: As needed   promethazine (PHENERGAN) 25 MG tablet Take 1 tablet (25 mg total) by mouth every 6 (six) hours as needed for nausea or vomiting. (Patient not taking: Reported on 02/21/2022) 03/09/2023: As needed   QUERCETIN PO Take 1 capsule by mouth daily. (Patient not taking: Reported on 03/09/2023) 03/09/2023: Did not take today    rosuvastatin (CRESTOR) 20 MG tablet Take 1 tablet (20 mg total) by mouth daily. (Patient not taking: Reported on 03/09/2023) 03/09/2023: Did not take today    scopolamine (TRANSDERM-SCOP) 1 MG/3DAYS Place 1 patch (1.5 mg total) onto the skin every 3 (three) days. (Patient not taking: Reported on 03/09/2023)    vitamin E 200 UNIT capsule Take 200 Units by mouth daily. (Patient not taking: Reported on 03/09/2023) 03/09/2023: Did not take today    ZINC PICOLINATE PO Take 1 capsule by mouth daily. (Patient not taking: Reported on 03/09/2023) 02/27/2023: Not currently    No facility-administered encounter medications on file as of 03/09/2023.   Allergies  Allergen Reactions   Penicillins     As a child  Has patient had a PCN reaction causing immediate rash, facial/tongue/throat swelling, SOB or lightheadedness with hypotension: unknown Has patient had a PCN reaction causing severe rash involving mucus membranes or skin necrosis: unknown Has patient had a PCN reaction that required hospitalization: unknown Has patient had a PCN reaction occurring within the last 10 years: no If all of the above answers are "NO", then may proceed with Cephalosporin use.   ROS: +chills, dizziness, n/v/d.  No URI symptoms. No chest pain, shortness of breath.  Sore all over from vomiting.  Diarrhea has improved. No bloody stools. See HPI     Observations/Objective:  Ht 5\' 5"  (1.651 m)   Wt 124 lb (56.2 kg) Comment: from last visit  LMP 11/06/2019   BMI 20.63 kg/m   Ill-appearing female--appears sleepy.  Walked around house some (to bathroom), some moaning. She is oriented, fairly alert (just intermittently sleepy--had been laying in bed at start of call). Grossly normal cranial nerves. Exam is very limited due  to the virtual nature of the visit.   Assessment and Plan:  Vomiting and diarrhea - based on acute onset, with f/c, suspect viral gastroenteritis. Given DM, ketones, dizziness and suspected dehydration, ER visit recommended  Type 1 diabetes mellitus with hypoglycemia and without coma (HCC) - +ketones, but normal glu.  To verify with fingerstick.  DM has been well controlled. Risk for hypoglycemia d/t not eating. monitor closely/adjust pump  She has Phenergan suppositories at home. Since diarrhea has improved, recommend using this. Continue zofran as needed. Imodium suggested, if any recurrence of diarrhea--doesn't think she can keep it down. Discussed small sips--ice chips, pedialyte pops.  She feels dizzy, and still vomiting. Encouraged her to go to  ER. Advised that my calling doesn't get her moved up the list. Discussed how triage works, and that all of the ER's likely have long wait times, due to the hospitals being fairly full, lots of boarders in the ER. Discussed options--can check if any UC's do IV fluids, vs calling paramedics--sometimes ambulances will give IVF and not take to ER if sx improve with hydration, vs private IVF companies. Reviewed red flag symptoms for which she must go to ER. She will discuss with her husband their next steps. All questions answered.   Follow Up Instructions:    I discussed the assessment and treatment plan with the patient. The patient was provided an opportunity to ask questions and all were answered. The patient agreed with the plan and demonstrated an understanding of the instructions.   The patient was advised to call back or seek an in-person evaluation if the symptoms worsen or if the condition fails to improve as anticipated.  I spent 18 minutes dedicated to the care of this patient, including pre-visit review of records, face to face time, post-visit ordering of testing and documentation.    Lavonda Jumbo, MD

## 2023-03-10 ENCOUNTER — Encounter: Payer: Self-pay | Admitting: Family Medicine

## 2023-05-22 ENCOUNTER — Other Ambulatory Visit: Payer: BC Managed Care – PPO

## 2023-05-22 DIAGNOSIS — E7800 Pure hypercholesterolemia, unspecified: Secondary | ICD-10-CM

## 2023-05-22 DIAGNOSIS — Z5181 Encounter for therapeutic drug level monitoring: Secondary | ICD-10-CM

## 2023-05-22 LAB — LIPID PANEL

## 2023-05-23 ENCOUNTER — Encounter: Payer: Self-pay | Admitting: Family Medicine

## 2023-05-23 LAB — LIPID PANEL
Cholesterol, Total: 154 mg/dL (ref 100–199)
HDL: 71 mg/dL (ref >39)
LDL CALC COMMENT:: 2.2 ratio (ref 0.0–4.4)
LDL Chol Calc (NIH): 74 mg/dL (ref 0–99)
Triglycerides: 37 mg/dL (ref 0–149)
VLDL Cholesterol Cal: 9 mg/dL (ref 5–40)

## 2023-05-23 LAB — HEPATIC FUNCTION PANEL
ALT: 20 IU/L (ref 0–32)
AST: 23 IU/L (ref 0–40)
Albumin: 3.8 g/dL (ref 3.8–4.9)
Alkaline Phosphatase: 51 IU/L (ref 44–121)
Bilirubin Total: 0.3 mg/dL (ref 0.0–1.2)
Bilirubin, Direct: 0.14 mg/dL (ref 0.00–0.40)
Total Protein: 5.5 g/dL — ABNORMAL LOW (ref 6.0–8.5)

## 2023-06-21 LAB — HEMOGLOBIN A1C: Hemoglobin A1C: 6.5

## 2023-11-27 LAB — HM MAMMOGRAPHY

## 2023-11-30 ENCOUNTER — Encounter: Payer: Self-pay | Admitting: *Deleted

## 2023-12-07 HISTORY — PX: ESOPHAGOGASTRODUODENOSCOPY: SHX1529

## 2023-12-07 LAB — HM COLONOSCOPY

## 2023-12-12 ENCOUNTER — Encounter: Payer: Self-pay | Admitting: *Deleted

## 2023-12-27 LAB — HEMOGLOBIN A1C: Hemoglobin A1C: 6.7

## 2023-12-28 ENCOUNTER — Other Ambulatory Visit: Payer: Self-pay | Admitting: *Deleted

## 2023-12-28 ENCOUNTER — Encounter: Payer: Self-pay | Admitting: *Deleted

## 2024-03-04 ENCOUNTER — Encounter: Payer: BC Managed Care – PPO | Admitting: Family Medicine

## 2024-03-27 ENCOUNTER — Encounter: Payer: Self-pay | Admitting: Family Medicine
